# Patient Record
Sex: Male | Born: 1955 | Hispanic: Yes | Marital: Single | Smoking: Never smoker
Health system: Southern US, Community
[De-identification: ages and names within clinical notes are randomized; demographics above are authoritative.]

## PROBLEM LIST (undated history)

## (undated) DIAGNOSIS — I10 Essential (primary) hypertension: Secondary | ICD-10-CM

## (undated) DIAGNOSIS — E119 Type 2 diabetes mellitus without complications: Secondary | ICD-10-CM

## (undated) HISTORY — PX: OTHER SURGICAL HISTORY: SHX169

## (undated) SURGICAL SUPPLY — 72 items
APPLIER CLIP 5 13 M/L LIGAMAX5 (MISCELLANEOUS)
APPLIER CLIP ROT 10 11.4 M/L (STAPLE)
BLADE EXTENDED COATED 6.5IN (ELECTRODE)
BLADE HEX COATED 2.75 (ELECTRODE) ×1
BLADE SURG SZ10 CARB STEEL (BLADE) ×1
CABLE HIGH FREQUENCY MONO STRZ (ELECTRODE) ×1
CHLORAPREP W/TINT 26 (MISCELLANEOUS) ×1
COUNTER NEEDLE 20 DBL MAG RED (NEEDLE) ×1
COVER MAYO STAND STRL (DRAPES) ×1
COVER SURGICAL LIGHT HANDLE (MISCELLANEOUS) ×1
COVER WAND RF STERILE (DRAPES)
DECANTER SPIKE VIAL GLASS SM (MISCELLANEOUS) ×1
DRAIN CHANNEL 19F RND (DRAIN) ×1
DRAPE LAPAROSCOPIC ABDOMINAL (DRAPES) ×1
DRAPE SHEET LG 3/4 BI-LAMINATE (DRAPES) ×1
DRAPE UTILITY XL STRL (DRAPES) ×2
DRAPE WARM FLUID 44X44 (DRAPES) ×1
DRSG OPSITE POSTOP 4X10 (GAUZE/BANDAGES/DRESSINGS)
DRSG OPSITE POSTOP 4X6 (GAUZE/BANDAGES/DRESSINGS) ×1
DRSG OPSITE POSTOP 4X8 (GAUZE/BANDAGES/DRESSINGS)
DRSG TEGADERM 2-3/8X2-3/4 SM (GAUZE/BANDAGES/DRESSINGS) ×2
DRSG TEGADERM 4X4.75 (GAUZE/BANDAGES/DRESSINGS) ×1
ELECT REM PT RETURN 15FT ADLT (MISCELLANEOUS) ×1
ENDOLOOP SUT PDS II  0 18 (SUTURE)
GAUZE SPONGE 4X4 12PLY STRL (GAUZE/BANDAGES/DRESSINGS) ×1
GLOVE ECLIPSE 8.0 STRL XLNG CF (GLOVE) ×2
GLOVE INDICATOR 8.0 STRL GRN (GLOVE) ×2
GOWN STRL REUS W/TWL XL LVL3 (GOWN DISPOSABLE) ×4
IRRIG SUCT STRYKERFLOW 2 WTIP (MISCELLANEOUS) ×1
KIT BASIN OR (CUSTOM PROCEDURE TRAY) ×1
KIT TURNOVER KIT A (KITS)
LEGGING LITHOTOMY PAIR STRL (DRAPES) ×1
PACK GENERAL/GYN (CUSTOM PROCEDURE TRAY) ×1
PAD POSITIONING PINK XL (MISCELLANEOUS) ×1
PENCIL SMOKE EVACUATOR (MISCELLANEOUS)
PROTECTOR NERVE ULNAR (MISCELLANEOUS) ×1
RTRCTR WOUND ALEXIS 18CM MED (MISCELLANEOUS)
SCISSORS LAP 5X35 DISP (ENDOMECHANICALS) ×1
SEALER TISSUE G2 STRG ARTC 35C (ENDOMECHANICALS) ×1
SET TUBE SMOKE EVAC HIGH FLOW (TUBING) ×1
SLEEVE XCEL OPT CAN 5 100 (ENDOMECHANICALS) ×3
SPONGE GAUZE 2X2 STER 10/PKG (GAUZE/BANDAGES/DRESSINGS) ×1
SPONGE LAP 18X18 RF (DISPOSABLE) ×1
STAPLER CUT CVD 40MM BLUE (STAPLE) ×1
STAPLER ECHELON LONG 60 440 (INSTRUMENTS) ×1
STAPLER RELOAD GREEN 60MM (STAPLE) ×2
STAPLER VISISTAT 35W (STAPLE) ×1
SUCTION POOLE HANDLE (INSTRUMENTS) ×1
SURGILUBE 2OZ TUBE FLIPTOP (MISCELLANEOUS)
SUT MNCRL AB 4-0 PS2 18 (SUTURE) ×1
SUT PDS AB 1 CTX 36 (SUTURE) ×2
SUT PDS AB 1 TP1 96 (SUTURE)
SUT PROLENE 0 CT 2 (SUTURE)
SUT PROLENE 2 0 KS (SUTURE) ×1
SUT PROLENE 2 0 SH DA (SUTURE) ×2
SUT SILK 2 0 (SUTURE) ×1
SUT SILK 2 0 SH CR/8 (SUTURE) ×1
SUT SILK 3 0 (SUTURE) ×1
SUT SILK 3 0 SH CR/8 (SUTURE) ×1
SUT VICRYL 0 UR6 27IN ABS (SUTURE)
SUT VLOC 180 0 9IN  GS21 (SUTURE) ×1
SUT VLOC 180 2-0 9IN GS21 (SUTURE) ×3
SYR BULB IRRIGATION 50ML (SYRINGE) ×1
SYS LAPSCP GELPORT 120MM (MISCELLANEOUS)
TAPE UMBILICAL COTTON 1/8X30 (MISCELLANEOUS) ×1
TOWEL OR 17X26 10 PK STRL BLUE (TOWEL DISPOSABLE) ×2
TOWEL OR NON WOVEN STRL DISP B (DISPOSABLE) ×2
TRAY FOLEY MTR SLVR 16FR STAT (SET/KITS/TRAYS/PACK) ×1
TRAY LAPAROSCOPIC (CUSTOM PROCEDURE TRAY) ×1
TROCAR BLADELESS OPT 5 100 (ENDOMECHANICALS) ×1
TROCAR XCEL NON-BLD 11X100MML (ENDOMECHANICALS)
YANKAUER SUCT BULB TIP 10FT TU (MISCELLANEOUS) ×1

---

## 2018-04-17 DIAGNOSIS — E119 Type 2 diabetes mellitus without complications: Secondary | ICD-10-CM

## 2018-04-17 DIAGNOSIS — J0100 Acute maxillary sinusitis, unspecified: Secondary | ICD-10-CM

## 2018-04-17 DIAGNOSIS — R200 Anesthesia of skin: Secondary | ICD-10-CM

## 2018-04-17 HISTORY — DX: Type 2 diabetes mellitus without complications: E11.9

## 2018-04-17 HISTORY — DX: Essential (primary) hypertension: I10
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
## 2018-04-17 NOTE — Discharge Instructions (Addendum)
Radiografa de trax negativa para neumona. Augmentin prescrito Flonase prescrito Tome OTC ibuprofeno / tylenol segn sea necesario para el alivio sintomtico Se colocan muequeras, se usan segn sea necesario para el alivio sintomtico. Se inici la asistencia del PCP para una evaluacin y [place] adicionales de las enfermedades crnicas y si los sntomas persisten Regrese o vaya a la sala de emergencias si tiene sntomas nuevos o que [place].  Metformina rellenada.

## 2018-04-17 NOTE — ED Triage Notes (Signed)
Pt here for left sided facial pain, ear pain x 2 weeks. sts also body aches. sts that he has been coughing.
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
## 2019-12-29 DIAGNOSIS — K56609 Unspecified intestinal obstruction, unspecified as to partial versus complete obstruction: Secondary | ICD-10-CM

## 2019-12-29 DIAGNOSIS — D630 Anemia in neoplastic disease: Secondary | ICD-10-CM | POA: Diagnosis present

## 2019-12-29 DIAGNOSIS — E440 Moderate protein-calorie malnutrition: Secondary | ICD-10-CM | POA: Diagnosis present

## 2019-12-29 DIAGNOSIS — K567 Ileus, unspecified: Secondary | ICD-10-CM | POA: Diagnosis not present

## 2019-12-29 DIAGNOSIS — Z20822 Contact with and (suspected) exposure to covid-19: Secondary | ICD-10-CM | POA: Diagnosis present

## 2019-12-29 DIAGNOSIS — I10 Essential (primary) hypertension: Secondary | ICD-10-CM | POA: Diagnosis present

## 2019-12-29 DIAGNOSIS — Z6829 Body mass index (BMI) 29.0-29.9, adult: Secondary | ICD-10-CM

## 2019-12-29 DIAGNOSIS — R1084 Generalized abdominal pain: Secondary | ICD-10-CM

## 2019-12-29 DIAGNOSIS — E1165 Type 2 diabetes mellitus with hyperglycemia: Secondary | ICD-10-CM | POA: Insufficient documentation

## 2019-12-29 DIAGNOSIS — Z789 Other specified health status: Secondary | ICD-10-CM

## 2019-12-29 DIAGNOSIS — C187 Malignant neoplasm of sigmoid colon: Secondary | ICD-10-CM

## 2019-12-29 DIAGNOSIS — IMO0002 Reserved for concepts with insufficient information to code with codable children: Secondary | ICD-10-CM | POA: Insufficient documentation

## 2019-12-29 NOTE — ED Provider Notes (Signed)
[place] EMERGENCY DEPARTMENT Provider Note   CSN: [id number] Arrival date & time: 12/29/19  2201     History Chief Complaint  Patient presents with  . Abdominal Pain    Dakota Washington is a 64 y.o. male.  The history is provided by the patient. A language interpreter was used.  Abdominal Pain Pain location:  Generalized Pain quality: aching and bloating   Pain radiates to:  Does not radiate Pain severity:  Mild Onset quality:  Gradual Duration:  5 days Timing:  Constant Progression:  Unchanged Chronicity:  New Context: not eating, not previous surgeries and not sick contacts   Context comment:  Patient states hx of DM, HTN but not taking medications who presents to ED with abdominal. No bowel movement for 4 days. Feels constipated and with gas pains. No passing gas, but not vomiting. No hx of abdominal surgeries.  Relieved by:  Nothing Worsened by:  Nothing Associated symptoms: constipation   Associated symptoms: no chest pain, no chills, no cough, no diarrhea, no dysuria, no fever, no flatus, no hematuria, no nausea, no shortness of breath, no sore throat and no vomiting   Risk factors: no alcohol abuse, has not had multiple surgeries and no NSAID use        No past medical history on file.  There are no problems to display for this patient.   PMH: Diabetes, HTN    No family history on file.  Social History   Tobacco Use  . Smoking status: Not on file  Substance Use Topics  . Alcohol use: Not on file  . Drug use: Not on file    Home Medications Prior to Admission medications   Not on File    Allergies    Patient has no known allergies.  Review of Systems   Review of Systems  Constitutional: Negative for chills and fever.  HENT: Negative for ear pain and sore throat.   Eyes: Negative for pain and visual disturbance.  Respiratory: Negative for cough and shortness of breath.   Cardiovascular: Negative for chest pain and  palpitations.  Gastrointestinal: Positive for abdominal pain and constipation. Negative for diarrhea, flatus, nausea and vomiting.  Genitourinary: Negative for dysuria and hematuria.  Musculoskeletal: Negative for arthralgias and back pain.  Skin: Negative for color change and rash.  Neurological: Negative for seizures and syncope.  All other systems reviewed and are negative.   Physical Exam Updated Vital Signs  ED Triage Vitals  Enc Vitals Group     BP 12/29/19 2214 (!) 176/93     Pulse Rate 12/29/19 2214 80     Resp 12/29/19 2214 20     Temp 12/29/19 2214 98.7 F (37.1 C)     Temp Source 12/29/19 2214 Oral     SpO2 12/29/19 2214 98 %     Weight 12/29/19 2214 165 lb (74.8 kg)     Height 12/29/19 2214 5\' 3"  (1.6 m)     Head Circumference --      Peak Flow --      Pain Score 12/29/19 2208 9     Pain Loc --      Pain Edu? --      Excl. in [place]? --     Physical Exam Vitals and nursing note reviewed.  Constitutional:      General: He is not in acute distress.    Appearance: He is well-developed. He is not ill-appearing.  HENT:     Head: Normocephalic and atraumatic.  Eyes:     Extraocular Movements: Extraocular movements intact.     Conjunctiva/sclera: Conjunctivae normal.     Pupils: Pupils are equal, round, and reactive to light.  Cardiovascular:     Rate and Rhythm: Normal rate and regular rhythm.     Heart sounds: Normal heart sounds. No murmur.  Pulmonary:     Effort: Pulmonary effort is normal. No respiratory distress.     Breath sounds: Normal breath sounds.  Abdominal:     General: There is no distension.     Palpations: Abdomen is soft.     Tenderness: There is generalized abdominal tenderness. There is no right CVA tenderness, left CVA tenderness, guarding or rebound. Negative signs include Murphy's sign.  Musculoskeletal:     Cervical back: Neck supple.  Skin:    General: Skin is warm and dry.     Capillary Refill: Capillary refill takes less than 2  seconds.  Neurological:     General: No focal deficit present.     Mental Status: He is alert.     ED Results / Procedures / Treatments   Labs (all labs ordered are listed, but only abnormal results are displayed) Labs Reviewed  CBG MONITORING, ED - Abnormal; Notable for the following components:      Result Value   Glucose-Capillary 237 (*)    All other components within normal limits  CBC WITH DIFFERENTIAL/PLATELET  COMPREHENSIVE METABOLIC PANEL  LIPASE, BLOOD  URINALYSIS, ROUTINE W REFLEX MICROSCOPIC    EKG None  Radiology No results found.  Procedures Procedures (including critical care time)  Medications Ordered in ED Medications  sodium chloride 0.9 % bolus 1,000 mL (1,000 mLs Intravenous New Bag/Given 12/29/19 2228)  morphine 4 MG/ML injection 4 mg (4 mg Intravenous Given 12/29/19 2225)    ED Course  I have reviewed the triage vital signs and the nursing notes.  Pertinent labs & imaging results that were available during my care of the patient were reviewed by me and considered in my medical decision making (see chart for details).    MDM Rules/Calculators/A&P  Dakota Washington is a 64 year old male with history of hypertension, diabetes who presents to the ED with abdominal pain.  Patient with high blood pressure but otherwise normal vitals.  Patient with abdominal pain for the last for 5 days.  Has been constipated.  But denies any nausea or vomiting.  Has not passed gas.  No history of abdominal surgery.  Socially drinks alcohol.  Has diffuse abdominal tenderness on exam.  Overall is well-appearing.  Will evaluate for any hepatobiliary process, pancreatitis with lab work.  We will get a CT scan to evaluate for bowel obstruction versus other intra-abdominal process.  Takes no medication any longer for his high blood pressure or diabetes.  Could be DKA or gastroparesis.  Will give IV fluids, IV morphine and reevaluate.  Patient has not taken any stool softener.  Pt  signed out to oncoming ED staff with patient pending lab work and imaging. Please see there not for further results, eval, dispo.   This chart was dictated using voice recognition software.  Despite best efforts to proofread,  errors can occur which can change the documentation meaning.    Final Clinical Impression(s) / ED Diagnoses Final diagnoses:  Generalized abdominal pain    Rx / DC Orders ED Discharge Orders    None       Lennice Sites, DO 12/29/19 2246

## 2019-12-29 NOTE — ED Triage Notes (Signed)
Pt c/o abdominal pain x 4 days with constipation. No english, his son is here with him and he speaks [place]

## 2019-12-30 DIAGNOSIS — K56609 Unspecified intestinal obstruction, unspecified as to partial versus complete obstruction: Secondary | ICD-10-CM | POA: Diagnosis present

## 2019-12-30 NOTE — ED Notes (Signed)
Report given to [facility], RN

## 2019-12-30 NOTE — ED Notes (Signed)
Report Received. Pt currently in x-ray

## 2019-12-30 NOTE — ED Notes (Addendum)
Spoke with pt via Opt[facility]. Updated regarding admission to [facility] hospital and waiting for transport. Pt called son to take his valuables (wallet and clothing) home. Pt also aware that his blood sugar will be checked frequently during his hospitalization and we are using a sliding scale when readings are high. Voiced understanding. States he has not been taking his blood sugar medications for months.

## 2019-12-30 NOTE — ED Notes (Signed)
[facility] notified (Tammy) - patient ready for transport 

## 2019-12-30 NOTE — ED Provider Notes (Signed)
Care assumed from Dr. Ronnald Nian at shift change.  Patient awaiting results of a CT scan.  Patient has been experiencing abdominal distention, cramping, and pain for the past 5 days.  He has not had a bowel movement in the past 4 or 5 days.  CT scan has returned and shows what appears to be a large bowel obstruction, the etiology of which is concerning for a possible apple core lesion in the distal colon.  These findings were discussed with Dr. Kae Heller from general surgery.  She agrees to accept the patient in transfer.  He will go to [facility] for further care.  Zosyn given intravenously.   Veryl Speak, MD 12/30/19 Dakota Washington

## 2019-12-30 NOTE — ED Notes (Signed)
Pt's son Ulice Dash took patients wallet, cash money, yellow color ring, shirt, pants, boots home with him and a brown jacket, underwear, and phone charger is left with patient.

## 2019-12-30 NOTE — Progress Notes (Addendum)
All admission questions completed using [employer] [phone] [place]. Pt alert & oriented, speaks spanish.

## 2019-12-30 NOTE — ED Notes (Signed)
[facility] here to transport pt to [facility]. Interpreter called and transfer process explained to patient.

## 2019-12-31 DIAGNOSIS — Z789 Other specified health status: Secondary | ICD-10-CM

## 2019-12-31 DIAGNOSIS — E1165 Type 2 diabetes mellitus with hyperglycemia: Secondary | ICD-10-CM | POA: Insufficient documentation

## 2019-12-31 DIAGNOSIS — IMO0002 Reserved for concepts with insufficient information to code with codable children: Secondary | ICD-10-CM | POA: Insufficient documentation

## 2019-12-31 DIAGNOSIS — K56609 Unspecified intestinal obstruction, unspecified as to partial versus complete obstruction: Secondary | ICD-10-CM

## 2019-12-31 HISTORY — PX: LAPAROSCOPIC SMALL BOWEL RESECTION: SHX5929
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
## 2019-12-31 NOTE — Consult Note (Signed)
[facility] Nurse ostomy consult note  [place] Nurse requested for preoperative stoma site marking by Dr. Johney Maine.  Discussed surgical procedure and stoma creation with patient.  Explained role of the [place] nurse team.  Answered patient questions.   Examined patient lying, sitting, and standing in order to place the marking in the patient's visual field, away from any creases or abdominal contour issues and within the rectus muscle.    Marked for colostomy in the LUQ, 7.5cm to the left of the umbilicus and [id number] above the umbilicus.  Patient's abdomen cleansed with CHG wipes at site markings, allowed to air dry prior to marking.Covered mark with thin film transparent dressing.   [facility] Nurse team will follow up with patient after surgery for continue ostomy care and teaching if a stoma is created intraoperatively.  Thanks, Maudie Flakes, MSN, RN, [place], Arther Abbott  Pager# [phone]

## 2019-12-31 NOTE — Anesthesia Preprocedure Evaluation (Signed)
Anesthesia Evaluation  Patient identified by MRN, date of birth, ID band Patient awake    Reviewed: Allergy & Precautions, NPO status , Patient's Chart, lab work & pertinent test results  History of Anesthesia Complications (+) PONV and history of anesthetic complications  Airway Mallampati: II  TM Distance: >3 FB Neck ROM: Full    Dental  (+) Dental Advisory Given   Pulmonary neg pulmonary ROS,    breath sounds clear to auscultation       Cardiovascular negative cardio ROS   Rhythm:Regular     Neuro/Psych negative neurological ROS  negative psych ROS   GI/Hepatic Neg liver ROS, colonic obstruction   Endo/Other  diabetes  Renal/GU negative Renal ROS     Musculoskeletal negative musculoskeletal ROS (+)   Abdominal   Peds  Hematology negative hematology ROS (+)   Anesthesia Other Findings   Reproductive/Obstetrics                            Anesthesia Physical Anesthesia Plan  ASA: II  Anesthesia Plan: General   Post-op Pain Management:    Induction: Intravenous  PONV Risk Score and Plan: 3 and Ondansetron, Dexamethasone, Propofol infusion and Midazolam  Airway Management Planned: Oral ETT  Additional Equipment: None  Intra-op Plan:   Post-operative Plan: Extubation in OR  Informed Consent: I have reviewed the patients History and Physical, chart, labs and discussed the procedure including the risks, benefits and alternatives for the proposed anesthesia with the patient or authorized representative who has indicated his/her understanding and acceptance.     Dental advisory given  Plan Discussed with: Surgeon and CRNA  Anesthesia Plan Comments:         Anesthesia Quick Evaluation

## 2019-12-31 NOTE — Progress Notes (Signed)
Two attempts to call and speak to Ulice Dash, pts son; no answer and no option for voicemail. Donne Hazel, RN
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
## 2019-12-31 NOTE — Transfer of Care (Signed)
Immediate Anesthesia Transfer of Care Note  Patient: Bert Herrera  Procedure(s) Performed: LAPAROSCOPIC sigmoid colectomy; hand sewn anastomosis, tap block rigid proctoscopy (N/A )  Patient Location: PACU  Anesthesia Type:General  Level of Consciousness: awake  Airway & Oxygen Therapy: Patient Spontanous Breathing and Patient connected to face mask oxygen  Post-op Assessment: Report given to RN, Post -op Vital signs reviewed and stable and Patient moving all extremities X 4  Post vital signs: Reviewed and stable  Last Vitals:  Vitals Value Taken Time  BP    Temp    Pulse 68 12/31/19 1516  Resp 21 12/31/19 1516  SpO2 100 % 12/31/19 1516  Vitals shown include unvalidated device data.  Last Pain:  Vitals:   12/31/19 0946  TempSrc:   PainSc: 0-No pain         Complications: No apparent anesthesia complications

## 2019-12-31 NOTE — Anesthesia Procedure Notes (Signed)
Procedure Name: Intubation Date/Time: 12/31/2019 11:34 AM Performed by: Niel Hummer, CRNA Pre-anesthesia Checklist: Patient identified, Emergency Drugs available, Suction available and Patient being monitored Patient Re-evaluated:Patient Re-evaluated prior to induction Oxygen Delivery Method: Circle system utilized Preoxygenation: Pre-oxygenation with 100% oxygen Induction Type: IV induction Ventilation: Mask ventilation without difficulty Laryngoscope Size: Mac and 4 Grade View: Grade I Tube type: Oral Tube size: 7.5 mm Number of attempts: 1 Airway Equipment and Method: Stylet Placement Confirmation: ETT inserted through vocal cords under direct vision,  positive ETCO2 and breath sounds checked- equal and bilateral Secured at: 23 cm Tube secured with: Tape Dental Injury: Teeth and Oropharynx as per pre-operative assessment

## 2019-12-31 NOTE — Progress Notes (Signed)
Short Stay staff called to inform me that surgery has been moved up to 1015. Attempted to call son twice but no answer and no voicemail option. Donne Hazel, RN

## 2020-01-01 NOTE — TOC Initial Note (Addendum)
Transition of Care [facility]) - Initial/Assessment Note    Patient Details  Name: Dakota Washington MRN: [id number] Date of Birth: 01/21/1956  Transition of Care [facility]) CM/SW Contact:    Lia Hopping, [place] Phone Number: 01/01/2020, 11:22 AM  Clinical Narrative:                 CSW met with the patient at bedside to discuss PCP and medication assistance. Patient called his son for support. Patient son express concerns about the patient not having insurance. CSW explain [employer] Counselor has completed a Insurance underwriter on patient behalf, it's unknown if the patient will qualify at this point.  CSW explain need for a PCP. CSW arranged [place] appointment for January 23, 2020 @ 9:20AM. Information written on patient AVS.  Patient will need assistance with medications at discharge. [place] letter will be provided.     Expected Discharge Plan: Home/Self Care Barriers to Discharge: Inadequate or no insurance   Patient Goals and CMS Choice     Choice offered to / list presented to : NA  Expected Discharge Plan and Services Expected Discharge Plan: Home/Self Care In-house Referral: Clinical Social Work Discharge Planning Services: NA Post Acute Care Choice: NA                                        Prior Living Arrangements/Services   Lives with:: Self Patient language and need for interpreter reviewed:: No Do you feel safe going back to the place where you live?: Yes      Need for Family Participation in Patient Care: Yes (Comment) Care giver support system in place?: No (comment)   Criminal Activity/Legal Involvement Pertinent to Current Situation/Hospitalization: No - Comment as needed  Activities of Daily Living Home Assistive Devices/Equipment: Dentures (specify type), CBG Meter ADL Screening (condition at time of admission) Patient's cognitive ability adequate to safely complete daily activities?: Yes Is the patient deaf or have  difficulty hearing?: No Does the patient have difficulty seeing, even when wearing glasses/contacts?: No Does the patient have difficulty concentrating, remembering, or making decisions?: No Patient able to express need for assistance with ADLs?: Yes Does the patient have difficulty dressing or bathing?: No Independently performs ADLs?: Yes (appropriate for developmental age) Does the patient have difficulty walking or climbing stairs?: No Weakness of Legs: None Weakness of Arms/Hands: None  Permission Sought/Granted Permission sought to share information with : Family Supports Permission granted to share information with : Yes, Verbal Permission Granted        Permission granted to share info w Relationship: Son "Dakota Washington"     Emotional Assessment Appearance:: Appears stated age Attitude/Demeanor/Rapport: Engaged Affect (typically observed): Accepting Orientation: : Oriented to Self, Oriented to Place, Oriented to  Time, Oriented to Situation Alcohol / Substance Use: Not Applicable Psych Involvement: No (comment)  Admission diagnosis:  Colonic obstruction ([place]) [K56.609] Generalized abdominal pain [R10.84] Large bowel obstruction ([place]) [K56.609] Patient Active Problem List   Diagnosis Date Noted  . Uses Spanish as primary spoken language 12/31/2019  . DM (diabetes mellitus), type 2, uncontrolled ([place])   . Large bowel obstruction ([place]) 12/30/2019  . Colonic obstruction ([place]) 12/30/2019   PCP:  Patient, No Pcp Per Pharmacy:  No Pharmacies Listed    Social Determinants of Health (SDOH) Interventions    Readmission Risk Interventions No flowsheet data found.

## 2020-01-01 NOTE — Progress Notes (Addendum)
Inpatient Diabetes Program Recommendations  AACE/ADA: New Consensus Statement on Inpatient Glycemic Control (2015)  Target Ranges:  Prepandial:   less than 140 mg/dL      Peak postprandial:   less than 180 mg/dL (1-2 hours)      Critically ill patients:  140 - 180 mg/dL   Lab Results  Component Value Date   GLUCAP 222 (H) 01/01/2020   HGBA1C 12.4 (H) 12/30/2019    [employer] interpreter ipad not available on floor for DM education.  Tried calling son to set up a time tomorrow 1/27 to speak with him and patient together regarding DM and insulin. No answer and voicemail box not set up for me to leave message will try again later.  Addendum 2:27 pm got in touch with Ulice Dash, pts son will meet 10 am 1/27 for insulin and DM teaching.  Thanks, Tama Headings RN, MSN, BC-ADM Inpatient Diabetes Coordinator Team Pager [phone] (8a-5p)

## 2020-01-01 NOTE — Progress Notes (Signed)
PROGRESS NOTE  Dakota Washington  DOB: 09/13/1956  PCP: Patient, No Pcp Per [id number]  DOA: 12/29/2019 Admitted From: Home  LOS: 2 days   Chief Complaint  Patient presents with   Abdominal Pain   Brief narrative: Patient is a 64 y.o. hispanic male with PMHx remarkable for diabetes mellitus and hypertension not on any meds at home. Patient presented to the ED on 1/23 with complaint of nausea/vomiting, generalized abdominal pain with constipation over the previous 5 days. He is found to have a large bowel obstruction with focus in the distal sigmoid colon.   Patient was admitted under general surgery service and started on conservative management 1/25, patient underwent laparoscopic sigmoid colectomy with anastomosis. Hospitalist medicine service was consulted for perioperative medical management.  Subjective: Patient was seen and examined this morning.  Pleasant middle-aged Hispanic male.  Sitting up in chair.  Not in distress.  Pain controlled. Patient's son was on the phone to help with interpretation.  Assessment/Plan: Large bowel obstruction -Patient presenting with 5-day history of nausea/vomiting and abdominal pain.   -Found to have large bowel obstruction with a focus at distal sigmoid colon.   -1/25, patient underwent laparoscopic sigmoid colectomy with anastomosis. -Currently improving postoperative ileus.  On dysphagia 1 diet.   Type 2 diabetes mellitus -uncontrolled, A1c 12.4 -He has been started on Lantus 12 units nightly from last night. -We will continue the same.  Once oral intake is allowed, will also start him on oral agents. -Continue sliding scale insulin with Accu-Cheks. -Diabetes coordinator consult consult.  Essential hypertension -Blood pressure fluctuating, mostly in normal range.  Continue to monitor.   -Likely to benefit from low-dose ACE inhibitor on discharge for renal protection  Mobility: Encourage ambulation Diet: Dysphagia 1 diet, to  advance per surgery Fluid:  DVT prophylaxis:  Lovenox subcu Code Status:  Full code Family Communication:  None Expected Discharge:  Continue inpatient management.  General surgery is the primary team.  Antimicrobials: Anti-infectives (From admission, onward)   Start     Dose/Rate Route Frequency Ordered Stop   12/31/19 2200  cefoTEtan (CEFOTAN) 2 g in sodium chloride 0.9 % 100 mL IVPB     2 g 200 mL/hr over 30 Minutes Intravenous Every 12 hours 12/31/19 1624 01/01/20 0553   12/31/19 1000  cefoTEtan (CEFOTAN) 2 g in sodium chloride 0.9 % 100 mL IVPB     2 g 200 mL/hr over 30 Minutes Intravenous On call to O.R. 12/31/19 0744 01/01/20 0501   12/30/19 0600  piperacillin-tazobactam (ZOSYN) IVPB 3.375 g  Status:  Discontinued     3.375 g 12.5 mL/hr over 240 Minutes Intravenous Every 8 hours 12/30/19 0256 12/31/19 0753   12/30/19 0015  piperacillin-tazobactam (ZOSYN) IVPB 3.375 g     3.375 g 12.5 mL/hr over 240 Minutes Intravenous  Once 12/30/19 0003 12/30/19 0146        Code Status: Full Code   Diet Order            DIET - DYS 1 Room service appropriate? Yes; Fluid consistency: Thin  Diet effective now              Infusions:   sodium chloride 250 mL (01/01/20 0554)   lactated ringers     methocarbamol (ROBAXIN) IV      Scheduled Meds:  acetaminophen  1,000 mg Oral Q6H   alvimopan  12 mg Oral BID   Chlorhexidine Gluconate Cloth  6 each Topical Daily   enoxaparin (LOVENOX) injection  40 mg  Subcutaneous Q24H   feeding supplement  237 mL Oral BID BM   feeding supplement (GLUCERNA SHAKE)  237 mL Oral BID BM   gabapentin  300 mg Oral BID   insulin aspart  0-15 Units Subcutaneous Q4H   insulin glargine  12 Units Subcutaneous QHS   lip balm  1 application Topical BID   multivitamin  15 mL Oral Daily   pantoprazole  40 mg Oral Daily   potassium chloride  30 mEq Oral BID   psyllium  1 packet Oral Daily   sodium chloride flush  3 mL Intravenous Q12H     PRN meds: sodium chloride, alum & mag hydroxide-simeth, diphenhydrAMINE **OR** diphenhydrAMINE, guaiFENesin-dextromethorphan, hydrALAZINE, hydrocortisone, hydrocortisone cream, HYDROmorphone (DILAUDID) injection, lactated ringers, magic mouthwash, menthol-cetylpyridinium, methocarbamol (ROBAXIN) IV, metoprolol tartrate, ondansetron **OR** ondansetron (ZOFRAN) IV, oxyCODONE, phenol, polyvinyl alcohol, prochlorperazine, sodium chloride flush   Objective: Vitals:   01/01/20 0754 01/01/20 1335  BP: 106/63 125/63  Pulse: 65 73  Resp:  16  Temp:  99.6 F (37.6 C)  SpO2: 100% 100%    Intake/Output Summary (Last 24 hours) at 01/01/2020 1353 Last data filed at 01/01/2020 0900 Gross per 24 hour  Intake 2671.5 ml  Output 2266 ml  Net 405.5 ml   Filed Weights   12/29/19 2214 12/31/19 0948  Weight: 74.8 kg 74.8 kg   Weight change:  Body mass index is 29.21 kg/m.   Physical Exam: General exam: Appears calm and comfortable.  Skin: No rashes, lesions or ulcers. HEENT: Atraumatic, normocephalic, supple neck, no obvious bleeding Lungs: Clear to auscultation bilaterally CVS: Regular rate rhythm good GI/Abd soft, mild appropriate tenderness postsurgical, bowel sounds sluggish CNS: Alert, awake, oriented x3 Psychiatry: Mood appropriate Extremities: No pedal edema, no calf tenderness  Data Review: I have personally reviewed the laboratory data and studies available.  Recent Labs  Lab 12/29/19 2229 12/30/19 0443 01/01/20 0315  WBC 10.8* 9.0 9.3  NEUTROABS 7.1  --   --   HGB 11.9* 11.4* 9.9*  HCT 38.2* 36.2* 32.2*  MCV 73.3* 74.0* 74.4*  PLT 386 344 329   Recent Labs  Lab 12/29/19 2229 12/30/19 0443 01/01/20 0315  NA 132* 134* 132*  K 3.4* 3.8 3.4*  CL 102 105 102  CO2 22 22 22   GLUCOSE 276* 292* 182*  BUN 14 12 11   CREATININE 0.61 0.54* 0.71  CALCIUM 8.5* 8.0* 7.7*  MG  --  2.0  --     Terrilee Croak, MD  [facility] 01/01/2020

## 2020-01-01 NOTE — Progress Notes (Signed)
Patient ID: Dakota Washington, male   DOB: 1956-09-28, 64 y.o.   MRN: [id number]    1 Day Post-Op  Subjective: Patient feels great today.  Denies any pain.  He is passing flatus.  Tolerated his D1 diet well this morning.  Has already walked.  ROS: See above, otherwise other systems negative  Objective: Vital signs in last 24 hours: Temp:  [97.9 F (36.6 C)-98.3 F (36.8 C)] 98.3 F (36.8 C) (01/26 0436) Pulse Rate:  [65-77] 65 (01/26 0754) Resp:  [17-29] 20 (01/26 0436) BP: (99-150)/(52-75) 106/63 (01/26 0754) SpO2:  [98 %-100 %] 100 % (01/26 0754) Last BM Date: 12/31/19  Intake/Output from previous day: 01/25 0701 - 01/26 0700 In: 2952.8 [P.O.:120; I.V.:2732.8; IV Piggyback:100] Out: 1841 [Urine:1450; Drains:365; Stool:1; Blood:25] Intake/Output this shift: Total I/O In: -  Out: 24 [Urine:425]  PE: Abd: soft, minimally tender, +BS, all incisions are intact, pfannenstiel incision with some old bloody drainage, but intact.  JP with serosang output  Lab Results:  Recent Labs    12/30/19 0443 01/01/20 0315  WBC 9.0 9.3  HGB 11.4* 9.9*  HCT 36.2* 32.2*  PLT 344 329   BMET Recent Labs    12/30/19 0443 01/01/20 0315  NA 134* 132*  K 3.8 3.4*  CL 105 102  CO2 22 22  GLUCOSE 292* 182*  BUN 12 11  CREATININE 0.54* 0.71  CALCIUM 8.0* 7.7*   PT/INR Recent Labs    12/30/19 0443  LABPROT 13.9  INR 1.1   CMP     Component Value Date/Time   NA 132 (L) 01/01/2020 0315   K 3.4 (L) 01/01/2020 0315   CL 102 01/01/2020 0315   CO2 22 01/01/2020 0315   GLUCOSE 182 (H) 01/01/2020 0315   BUN 11 01/01/2020 0315   CREATININE 0.71 01/01/2020 0315   CALCIUM 7.7 (L) 01/01/2020 0315   PROT 8.1 12/29/2019 2229   ALBUMIN 3.8 12/29/2019 2229   AST 31 12/29/2019 2229   ALT 34 12/29/2019 2229   ALKPHOS 98 12/29/2019 2229   BILITOT 0.7 12/29/2019 2229   GFRNONAA >60 01/01/2020 0315   GFRAA >60 01/01/2020 0315   Lipase     Component Value Date/Time   LIPASE 29  12/29/2019 2229       Studies/Results: No results found.  Anti-infectives: Anti-infectives (From admission, onward)   Start     Dose/Rate Route Frequency Ordered Stop   12/31/19 2200  cefoTEtan (CEFOTAN) 2 g in sodium chloride 0.9 % 100 mL IVPB     2 g 200 mL/hr over 30 Minutes Intravenous Every 12 hours 12/31/19 1624 01/01/20 0553   12/31/19 1000  cefoTEtan (CEFOTAN) 2 g in sodium chloride 0.9 % 100 mL IVPB     2 g 200 mL/hr over 30 Minutes Intravenous On call to O.R. 12/31/19 0744 01/01/20 0501   12/30/19 0600  piperacillin-tazobactam (ZOSYN) IVPB 3.375 g  Status:  Discontinued     3.375 g 12.5 mL/hr over 240 Minutes Intravenous Every 8 hours 12/30/19 0256 12/31/19 0753   12/30/19 0015  piperacillin-tazobactam (ZOSYN) IVPB 3.375 g     3.375 g 12.5 mL/hr over 240 Minutes Intravenous  Once 12/30/19 0003 12/30/19 0146       Assessment/Plan POD 1, s/p lap assisted sigmoid colectomy for LBO -ERAS protocol in place.   -D1 diet today -mobilize -entereg -good pain control -1 dose of post op abx per protocol -cont JP Drain, but can be DC prior to discharge if he is moving his bowels  with minimal serosang output.  FEN: D1 diet, SLV VTE: Lovenox ID: 1 dose post op given    LOS: 2 days    Henreitta Cea , [facility] 01/01/2020, 10:16 AM Please see Amion for pager number during day hours 7:00am-4:30pm or 7:00am -11:30am on weekends

## 2020-01-02 DIAGNOSIS — C187 Malignant neoplasm of sigmoid colon: Secondary | ICD-10-CM

## 2020-01-02 NOTE — Anesthesia Postprocedure Evaluation (Signed)
Anesthesia Post Note  Patient: Dakota Washington  Procedure(s) Performed: LAPAROSCOPIC sigmoid colectomy; hand sewn anastomosis, tap block rigid proctoscopy (N/A )     Patient location during evaluation: PACU Anesthesia Type: General Level of consciousness: awake and alert Pain management: pain level controlled Vital Signs Assessment: post-procedure vital signs reviewed and stable Respiratory status: spontaneous breathing, nonlabored ventilation, respiratory function stable and patient connected to nasal cannula oxygen Cardiovascular status: blood pressure returned to baseline and stable Postop Assessment: no apparent nausea or vomiting Anesthetic complications: no    Last Vitals:  Vitals:   01/02/20 0414 01/02/20 0758  BP: 138/73 (!) 148/67  Pulse: 72 82  Resp: 16 16  Temp: 37.2 C 36.9 C  SpO2: 100% 100%    Last Pain:  Vitals:   01/02/20 0758  TempSrc: Oral  PainSc: 1                  Sharayah Renfrow

## 2020-01-02 NOTE — Progress Notes (Signed)
Inpatient Diabetes Program Recommendations  AACE/ADA: New Consensus Statement on Inpatient Glycemic Control (2015)  Target Ranges:  Prepandial:   less than 140 mg/dL      Peak postprandial:   less than 180 mg/dL (1-2 hours)      Critically ill patients:  140 - 180 mg/dL   Lab Results  Component Value Date   GLUCAP 141 (H) 01/02/2020   HGBA1C 12.4 (H) 12/30/2019    Review of Glycemic Control  Diabetes history: DM 2  Spoke with patient and son regarding A1c level and glucose control at home. Patient has a follow up appointment at [facility] on February 17 th. Discussed A1C results (12.4% this admission). Discussed glucose and A1C goals. Discussed importance of checking CBGs and maintaining good CBG control to prevent long-term and short-term complications. Explained how hyperglycemia leads to damage within blood vessels which lead to the common complications seen with uncontrolled diabetes. Stressed to the patient the importance of improving glycemic control to prevent further complications from uncontrolled diabetes.   Discussed hyper/hypoglycemia s/s and treatment for both.  Discussed impact of nutrition, exercise, stress, sickness, and medications on diabetes control. Discussed carbohydrates, carbohydrate goals per day and meal, along with portion sizes. Encouraged patient to check glucose at least 2 times per day (fasting and alternating second check) and to keep a log book of glucose readings and insulin taken which will need to be taken to doctor appointments. Explained how the doctor can use the log book to continue to make insulin adjustments if needed.    Educated patient and son on insulin pen use at home. Reviewed contents of insulin flexpen starter kit. Reviewed all steps if insulin pen including attachment of needle, 2-unit air shot, dialing up dose, giving injection, removing needle, disposal of sharps, storage of unused insulin, disposal of insulin etc. Patient able  to provide successful return demonstration. Also reviewed troubleshooting with insulin pen.   Also showed how to use Vial and syringe method in case it has to be used in the future.  MD to give patient Rxs for insulin pens and insulin pen needles.  Patient and son verbalized understanding of information discussed and he states that he has no further questions at this time related to diabetes.  Thanks,  Tama Headings RN, MSN, BC-ADM Inpatient Diabetes Coordinator Team Pager [phone] (8a-5p)

## 2020-01-02 NOTE — Progress Notes (Signed)
Pharmacy Brief Note - Alvimopan (Entereg)  The standing order set for alvimopan (Entereg) now includes an automatic order to discontinue the drug after the patient has had a bowel movement. The change was approved by the [place] and the Medical Executive Committee.   This patient has had bowel movements documented by nursing. Therefore, alvimopan has been discontinued. If there are questions, please contact the pharmacy at [phone].   Thank you- Dolly Rias RPh 01/02/2020, 11:49 AM

## 2020-01-02 NOTE — Progress Notes (Signed)
Patient ID: Dakota Washington, male   DOB: 01/21/1956, 64 y.o.   MRN: [id number]    2 Days Post-Op  Subjective: Patient feels well today.  Up walking around his room.  Had another BM today with no blood present.  No nausea.  DM coordinator in room now for education with the patient and son.  Had a long conversation with the son about post op expectations, path is pending, etc.  He is very Patent attorney.  ROS: See above, otherwise other systems negative  Objective: Vital signs in last 24 hours: Temp:  [98.4 F (36.9 C)-99.6 F (37.6 C)] 98.4 F (36.9 C) (01/27 0758) Pulse Rate:  [72-82] 82 (01/27 0758) Resp:  [16-17] 16 (01/27 0758) BP: (125-148)/(63-73) 148/67 (01/27 0758) SpO2:  [98 %-100 %] 100 % (01/27 0758) Last BM Date: 01/02/20  Intake/Output from previous day: 01/26 0701 - 01/27 0700 In: 873.7 [P.O.:720; I.V.:153.7] Out: 2235 [Urine:2100; Drains:135] Intake/Output this shift: Total I/O In: 480 [P.O.:480] Out: -   PE: Abd: soft, appropriately tender, lower incision is stable with wicks in place, all other lap incisions are covered with no drainage, +BS, ND  Lab Results:  Recent Labs    01/01/20 0315 01/02/20 0323  WBC 9.3 10.2  HGB 9.9* 9.9*  HCT 32.2* 31.5*  PLT 329 321   BMET Recent Labs    01/01/20 0315 01/02/20 0323  NA 132* 140  K 3.4* 3.7  CL 102 109  CO2 22 25  GLUCOSE 182* 95  BUN 11 10  CREATININE 0.71 0.69  CALCIUM 7.7* 8.3*   PT/INR No results for input(s): LABPROT, INR in the last 72 hours. CMP     Component Value Date/Time   NA 140 01/02/2020 0323   K 3.7 01/02/2020 0323   CL 109 01/02/2020 0323   CO2 25 01/02/2020 0323   GLUCOSE 95 01/02/2020 0323   BUN 10 01/02/2020 0323   CREATININE 0.69 01/02/2020 0323   CALCIUM 8.3 (L) 01/02/2020 0323   PROT 8.1 12/29/2019 2229   ALBUMIN 3.8 12/29/2019 2229   AST 31 12/29/2019 2229   ALT 34 12/29/2019 2229   ALKPHOS 98 12/29/2019 2229   BILITOT 0.7 12/29/2019 2229   GFRNONAA >60  01/02/2020 0323   GFRAA >60 01/02/2020 0323   Lipase     Component Value Date/Time   LIPASE 29 12/29/2019 2229       Studies/Results: No results found.  Anti-infectives: Anti-infectives (From admission, onward)   Start     Dose/Rate Route Frequency Ordered Stop   12/31/19 2200  cefoTEtan (CEFOTAN) 2 g in sodium chloride 0.9 % 100 mL IVPB     2 g 200 mL/hr over 30 Minutes Intravenous Every 12 hours 12/31/19 1624 01/01/20 0553   12/31/19 1000  cefoTEtan (CEFOTAN) 2 g in sodium chloride 0.9 % 100 mL IVPB     2 g 200 mL/hr over 30 Minutes Intravenous On call to O.R. 12/31/19 0744 01/01/20 0501   12/30/19 0600  piperacillin-tazobactam (ZOSYN) IVPB 3.375 g  Status:  Discontinued     3.375 g 12.5 mL/hr over 240 Minutes Intravenous Every 8 hours 12/30/19 0256 12/31/19 0753   12/30/19 0015  piperacillin-tazobactam (ZOSYN) IVPB 3.375 g     3.375 g 12.5 mL/hr over 240 Minutes Intravenous  Once 12/30/19 0003 12/30/19 0146       Assessment/Plan Uncontrolled DM - appreciate medicine assistance as well as DM coordinator  POD 2, s/p lap assisted sigmoid colectomy for LBO -ERAS protocol in place.   -  soft, carb mod diet today -mobilize -entereg -good pain control -1 dose of post op abx per protocol -cont JP Drain, but can be DC prior to discharge if he is moving his bowels with minimal serosang output. -path pending  FEN: low fiber, carb mod diet, SLIV VTE: Lovenox ID: 1 dose post op given Dispo - suspect home in am   LOS: 3 days    Henreitta Cea , [facility] 01/02/2020, 11:33 AM Please see Amion for pager number during day hours 7:00am-4:30pm or 7:00am -11:30am on weekends

## 2020-01-03 NOTE — Progress Notes (Signed)
Diabetes coordinator worked with patient and son yesterday by showing them insulin pen and how to use.   Patient does not have insurance per [employer]. If discharged on insulin, will need affordable insulin such as [employer] Relion insulin pens (70/30 insulin or NPH, Regular) Patient will need to follow up with a PCP at [place].   Will continue to monitor blood sugars while in the hospital.  Harvel Ricks RN BSN CDE Diabetes Coordinator Pager: [phone]  8am-5pm

## 2020-01-03 NOTE — Discharge Summary (Signed)
Patient ID: Dakota Washington [id number] 01-25-1956 64 y.o.  Admit date: 12/29/2019 Discharge date: 01/03/2020  Admitting Diagnosis: Large bowel obstruction DM  Discharge Diagnosis Patient Active Problem List   Diagnosis Date Noted  . Cancer of sigmoid colon pT4a, pN1c (0/19 LN) s/p lap colectomy 1/272021 01/02/2020  . Uses Spanish as primary spoken language 12/31/2019  . DM (diabetes mellitus), type 2, uncontrolled ([place])   . Large bowel obstruction ([place]) 12/30/2019  . Colonic obstruction ([place]) 12/30/2019  adenocarcinoma of colon, T4, N1   Consultants Internal medicine  Reason for Admission: 64yo spanish-speaking male with generalized abdominal pain and constipation x 5 days.  He has a history of diabetes and hypertension but not taking any medications.  Denies nausea/emesis.  Denies history of abdominal surgeries.  Prior to the onset of the symptoms he reports no issues with his bowel movements, denies any melena or hematochezia.  He has never had a colonoscopy.  Currently his pain is somewhat improved with medication.  Procedures Dr. Johney Maine, 12/31/19  LAPAROSCOPIC SIGMOID COLECTOMY with hand sewn anastomosis OMENTOPEXY TAP BLOCK - BILATERAL RIGID PROCTOSCOPY  Hospital Course:  Large bowel obstruction: The patient was admitted and taken to the OR where he underwent the above procedure.  He tolerated this procedure well and was placed on the ERAS protocol post operatively.  He was started on clear liquids right away and advanced within the first 2 days to a soft diet.  He was moving his bowels well. Pain was controlled and he was voiding as well.  He did have a JP drain placed to evaluate for post op leak.  This remained serosang with minimal output and was pulled on POD 3 prior to discharge.  His pathology returned showing T4, N1 adenocarcinoma.  This pathology was discussed with he and his son prior to discharge.  Our office is working on a referral to med onc as well as  placing him on the tumor board agenda to further discuss treatment.  DM: The patient does not have insurance and has not been able to afford his meds so he has uncontrolled DM.  The hospitalist were consulted for management and better control of this.  He was placed on lantus, metformin, and glipizide.  His sugars were much better controlled.  He and his son had a lot of education by the diabetes coordinator as well.  He will have follow up arranged at the [facility].  A Medicaid application was started for him as well during his stay.    On POD 3, the patient was surgically and medically stable for DC home.  Physical Exam: Heart: regular Lungs: CTAB Abd: soft, appropriately tender, pfannenstiel incision wicks removed and otherwise intact.  No evidence of infection or erythema.  JP is serosang and will be removed prior to discharge.  Allergies as of 01/03/2020   No Known Allergies     Medication List    TAKE these medications   acetaminophen 500 MG tablet Commonly known as: TYLENOL Take 2 tablets (1,000 mg total) by mouth every 6 (six) hours as needed.   gabapentin 300 MG capsule Commonly known as: NEURONTIN Take 1 capsule (300 mg total) by mouth 2 (two) times daily.   glipiZIDE 5 MG tablet Commonly known as: GLUCOTROL Take 0.5 tablets (2.5 mg total) by mouth daily before breakfast. Start taking on: January 04, 2020   glucose monitoring kit monitoring kit 1 each by Does not apply route daily as needed for other.  1 month Diabetic Testing Supplies for QAC-QHS accuchecks. Notes to patient: Use as prescribed   Insulin Glargine 100 UNIT/ML Solostar Pen Commonly known as: LANTUS Inject 10 Units into the skin daily at 10 pm.   metFORMIN 500 MG tablet Commonly known as: GLUCOPHAGE Take 1 tablet (500 mg total) by mouth 2 (two) times daily with a meal.   methocarbamol 500 MG tablet Commonly known as: Robaxin Take 2 tablets (1,000 mg total) by mouth every 8  (eight) hours as needed for muscle spasms.   oxyCODONE 5 MG immediate release tablet Commonly known as: Oxy IR/ROXICODONE Take 1-2 tablets (5-10 mg total) by mouth every 6 (six) hours as needed for up to 7 days for moderate pain.        Follow-up [place]. Go to.   Specialty: Internal Medicine Why: Your medical appointment is scheduled for January 23, 2020 at 9:20am. Please arrive 15 minutes early.  Su cita mdica est programada para el 42 de febrero de 2021 a las 9:20 a.m. Por favor, llegue 15 minutos antes. Contact information: [place] [id number] [place] [address] [phone]       Michael Boston, MD Follow up on 01/21/2020.   Specialty: General Surgery Why: 11:30am, arrive by 11:00am for paperwork and check in process. Bring photo ID and insurance information. Contact information: [address] [place] [address] [place] Follow up.   Why: They will call you to give you the information regarding a follow up appointment to discuss further treatment          Signed: Saverio Danker, [facility] 01/03/2020, 12:34 PM Please see Amion for pager number during day hours 7:00am-4:30pm

## 2020-01-03 NOTE — TOC Transition Note (Signed)
Transition of Care [facility]) - CM/SW Discharge Note   Patient Details  Name: [name] MRN: [id number] Date of Birth: 01/21/1956  Transition of Care [facility]) CM/SW Contact:  Lia Hopping, [place] Phone Number: 01/03/2020, 11:09 AM   Clinical Narrative:    CSW used the interpreting service [phone]. CSW explain the [facility] letter. Patient gave CSW permission to talk with this daughter Evelena Peat. He reports he plans to stay with her until he feels well enough to return home. CSW reached out to [place] and explained the [facility] letter process/narcotics not included.Patient daughter report understanding.    Final next level of care: Home/Self Care Barriers to Discharge: Barriers Resolved   Patient Goals and CMS Choice     Choice offered to / list presented to : NA  Discharge Placement                  Name of family member notified: Daughter [facility]    Discharge Plan and Services In-house Referral: Clinical Social Work Discharge Planning Services: NA Post Acute Care Choice: NA                               Social Determinants of Health (SDOH) Interventions     Readmission Risk Interventions No flowsheet data found.

## 2020-01-03 NOTE — Discharge Instructions (Signed)
A1c 12.4% goal 7% or less Carbs per meal 60-75 grams Per snack 15-30 grams  Low blood sugar less than 70 Silly sweaty shaky  quick sugar:  4 oz regular juice or soda or 4 glucose (sugar) tablets from [facility] (10ct. For $1) 15 grams of carbs wait 15 minutes then recheck glucose if still symptomatic treat again. If repeating a lot call 911  Exercise 30 minutes 5 days a week Check blood sugar 2 hours after exercise and at bedtime (avoid hypoglycemia)  Regularly check glucose first thing in the morning before you eat. Then alternate a second check (before lunch, before supper another day, before bedtime another day)   Glucose Products:  ReliOnT glucose products raise low blood sugar fast. Tablets are free of fat, caffeine, sodium and gluten. They are portable and easy to carry, making it easier for people with diabetes to BE PREPARED for lows.  Glucose Tablets Available in 6 flavors . 10 ct...................................... $1.00 . 50 ct...................................... $3.98 Glucose Shot..................................$1.48 Glucose Gel....................................$3.44  Alcohol Swabs Alcohol swabs are used to sterilize your injection site. All of our swabs are individually wrapped for maximum safety, convenience and moisture retention. ReliOnT Alcohol Swabs . 100 ct Swabs..............................$1.00 . 400 ct Swabs..............................$3.74  Lancets ReliOnT offers three lancet options conveniently designed to work with almost every lancing device. Each features a protective disk, which guarantees sterility before testing. ReliOnT Lancets . 100 ct Lancets $1.56 . 200 ct Lancets $2.64 Available in Ultra-Thin, Thin & Micro-Thin ReliOnT 2-IN-1 Lancing Device . 50 ct Lancets..................................... $3.44 Available in 30 gauge and 25 gauge ReliOnT Lancing Device....................$5.84  Blood Glucose Monitors ReliOnT offers a full  range of blood glucose testing options to provide an accurate, affordable system that meets each person's unique needs and preferences. Prime Meter....................................... $9.00 Prime Test Strips . 25 test strips.................................... $5.00 . 50 test strips.................................... $9.00 . 100 test strips.................................$17.88 Premier BLU Meter  ............  $18.98 Premier Voice Meter  .............  $14.98 Premier Test Strips . 50 test strips.................................... $9.00 . 100 test strips.................................$17.88 Premier [employer]  ............  $19.44 Kit includes: . 50 test strips . 10 lancets . Lancing device . Carry case  Ketone Test Strips . 50 test strips  ................  $6.64  Human Insulin  Novolin/ReliOnT (recombinant DNA origin) is manufactured for [employer] by [employer] Insulin* with Vial..........$24.88 Available in N, R, 70/30 Novolin/ReliOnT Insulin Pens*  .....  $42.88 Available in N, R, 70/30  Insulin Delivery ReliOnT syringes and pen needles provide precision technology, comfort and accuracy in insulin delivery at affordable prices. ReliOnT Pen Needles* . 50 ct....................................................$9.00 Available in 74m, 684m 89m73m 58m90mliOnT Insulin Syringes* . 100 ct ............ $12.58 Available in 29G, 30G & 31G (3/10cc, 1/2cc & 1cc units)   SURGERY: POST OP INSTRUCTIONS (Surgery for small bowel obstruction, colon resection, etc)   ######################################################################  EAT Gradually transition to a high fiber diet with a fiber supplement over the next few days after discharge  WALK Walk an hour a day.  Control your pain to do that.    CONTROL PAIN Control pain so that you can walk, sleep, tolerate sneezing/coughing, go up/down stairs.  HAVE A BOWEL MOVEMENT DAILY Keep your  bowels regular to avoid problems.  OK to try a laxative to override constipation.  OK to use an antidairrheal to slow down diarrhea.  Call if not better after 2 tries  CALL IF YOU HAVE PROBLEMS/CONCERNS Call if you are still struggling despite following these instructions. Call if you  have concerns not answered by these instructions  ######################################################################   DIET Follow a light diet the first few days at home.  Start with a bland diet such as soups, liquids, starchy foods, low fat foods, etc.  If you feel full, bloated, or constipated, stay on a ful liquid or pureed/blenderized diet for a few days until you feel better and no longer constipated. Be sure to drink plenty of fluids every day to avoid getting dehydrated (feeling dizzy, not urinating, etc.). Gradually add a fiber supplement to your diet over the next week.  Gradually get back to a regular solid diet.  Avoid fast food or heavy meals the first week as you are more likely to get nauseated. It is expected for your digestive tract to need a few months to get back to normal.  It is common for your bowel movements and stools to be irregular.  You will have occasional bloating and cramping that should eventually fade away.  Until you are eating solid food normally, off all pain medications, and back to regular activities; your bowels will not be normal. Focus on eating a low-fat, high fiber diet the rest of your life (See Getting to [place], below).  CARE of your INCISION or WOUND It is good for closed incision and even open wounds to be washed every day.  Shower every day.  Short baths are fine.  Wash the incisions and wounds clean with soap & water.    If you have a closed incision(s), wash the incision with soap & water every day.  You may leave closed incisions open to air if it is dry.   You may cover the incision with clean gauze & replace it after your daily shower for  comfort. If you have skin tapes (Steristrips) or skin glue (Dermabond) on your incision, leave them in place.  They will fall off on their own like a scab.  You may trim any edges that curl up with clean scissors.  If you have staples, set up an appointment for them to be removed in the office in 10 days after surgery.  If you have a drain, wash around the skin exit site with soap & water and place a new dressing of gauze or band aid around the skin every day.  Keep the drain site clean & dry.    If you have an open wound with packing, see wound care instructions.  In general, it is encouraged that you remove your dressing and packing, shower with soap & water, and replace your dressing once a day.  Pack the wound with clean gauze moistened with normal (0.9%) saline to keep the wound moist & uninfected.  Pressure on the dressing for 30 minutes will stop most wound bleeding.  Eventually your body will heal & pull the open wound closed over the next few months.  Raw open wounds will occasionally bleed or secrete yellow drainage until it heals closed.  Drain sites will drain a little until the drain is removed.  Even closed incisions can have mild bleeding or drainage the first few days until the skin edges scab over & seal.   If you have an open wound with a wound vac, see wound vac care instructions.     ACTIVITIES as tolerated Start light daily activities --- self-care, walking, climbing stairs-- beginning the day after surgery.  Gradually increase activities as tolerated.  Control your pain to be active.  Stop when you are tired.  Ideally,  walk several times a day, eventually an hour a day.   Most people are back to most day-to-day activities in a few weeks.  It takes 4-8 weeks to get back to unrestricted, intense activity. If you can walk 30 minutes without difficulty, it is safe to try more intense activity such as jogging, treadmill, bicycling, low-impact aerobics, swimming, etc. Save the most  intensive and strenuous activity for last (Usually 4-8 weeks after surgery) such as sit-ups, heavy lifting, contact sports, etc.  Refrain from any intense heavy lifting or straining until you are off narcotics for pain control.  You will have off days, but things should improve week-by-week. DO NOT PUSH THROUGH PAIN.  Let pain be your guide: If it hurts to do something, don't do it.  Pain is your body warning you to avoid that activity for another week until the pain goes down. You may drive when you are no longer taking narcotic prescription pain medication, you can comfortably wear a seatbelt, and you can safely make sudden turns/stops to protect yourself without hesitating due to pain. You may have sexual intercourse when it is comfortable. If it hurts to do something, stop.  MEDICATIONS Take your usually prescribed home medications unless otherwise directed.   Blood thinners:  Usually you can restart any strong blood thinners after the second postoperative day.  It is OK to take aspirin right away.     If you are on strong blood thinners (warfarin/Coumadin, Plavix, Xerelto, Eliquis, Pradaxa, etc), discuss with your surgeon, medicine PCP, and/or cardiologist for instructions on when to restart the blood thinner & if blood monitoring is needed (PT/INR blood check, etc).     PAIN CONTROL Pain after surgery or related to activity is often due to strain/injury to muscle, tendon, nerves and/or incisions.  This pain is usually short-term and will improve in a few months.  To help speed the process of healing and to get back to regular activity more quickly, DO THE FOLLOWING THINGS TOGETHER: 1. Increase activity gradually.  DO NOT PUSH THROUGH PAIN 2. Use Ice and/or Heat 3. Try Gentle Massage and/or Stretching 4. Take over the counter pain medication 5. Take Narcotic prescription pain medication for more severe pain  Good pain control = faster recovery.  It is better to take more medicine to be more  active than to stay in bed all day to avoid medications. 1.  Increase activity gradually Avoid heavy lifting at first, then increase to lifting as tolerated over the next 6 weeks. Do not "push through" the pain.  Listen to your body and avoid positions and maneuvers than reproduce the pain.  Wait a few days before trying something more intense Walking an hour a day is encouraged to help your body recover faster and more safely.  Start slowly and stop when getting sore.  If you can walk 30 minutes without stopping or pain, you can try more intense activity (running, jogging, aerobics, cycling, swimming, treadmill, sex, sports, weightlifting, etc.) Remember: If it hurts to do it, then don't do it! 2. Use Ice and/or Heat You will have swelling and bruising around the incisions.  This will take several weeks to resolve. Ice packs or heating pads (6-8 times a day, 30-60 minutes at a time) will help sooth soreness & bruising. Some people prefer to use ice alone, heat alone, or alternate between ice & heat.  Experiment and see what works best for you.  Consider trying ice for the first few days to help  decrease swelling and bruising; then, switch to heat to help relax sore spots and speed recovery. Shower every day.  Short baths are fine.  It feels good!  Keep the incisions and wounds clean with soap & water.   3. Try Gentle Massage and/or Stretching Massage at the area of pain many times a day Stop if you feel pain - do not overdo it 4. Take over the counter pain medication This helps the muscle and nerve tissues become less irritable and calm down faster Choose ONE of the following over-the-counter anti-inflammatory medications: Acetaminophen 557m tabs (Tylenol) 1-2 pills with every meal and just before bedtime (avoid if you have liver problems or if you have acetaminophen in you narcotic prescription) Naproxen 2231mtabs (ex. Aleve, Naprosyn) 1-2 pills twice a day (avoid if you have kidney, stomach,  IBD, or bleeding problems) Ibuprofen 20034mabs (ex. Advil, Motrin) 3-4 pills with every meal and just before bedtime (avoid if you have kidney, stomach, IBD, or bleeding problems) Take with food/snack several times a day as directed for at least 2 weeks to help keep pain / soreness down & more manageable. 5. Take Narcotic prescription pain medication for more severe pain A prescription for strong pain control is often given to you upon discharge (for example: oxycodone/Percocet, hydrocodone/Norco/Vicodin, or tramadol/Ultram) Take your pain medication as prescribed. Be mindful that most narcotic prescriptions contain Tylenol (acetaminophen) as well - avoid taking too much Tylenol. If you are having problems/concerns with the prescription medicine (does not control pain, nausea, vomiting, rash, itching, etc.), please call us [phone] see if we need to switch you to a different pain medicine that will work better for you and/or control your side effects better. If you need a refill on your pain medication, you must call the office before 4 pm and on weekdays only.  By federal law, prescriptions for narcotics cannot be called into a pharmacy.  They must be filled out on paper & picked up from our office by the patient or authorized caretaker.  Prescriptions cannot be filled after 4 pm nor on weekends.    WHEN TO CALL US [phone] uncontrolled or worsening pain  Fever over 101 F (38.5 C) Concerns with the incision: Worsening pain, redness, rash/hives, swelling, bleeding, or drainage Reactions / problems with new medications (itching, rash, hives, nausea, etc.) Nausea and/or vomiting Difficulty urinating Difficulty breathing Worsening fatigue, dizziness, lightheadedness, blurred vision Other concerns If you are not getting better after two weeks or are noticing you are getting worse, contact our office [phone] for further advice.  We may need to adjust your medications,  re-evaluate you in the office, send you to the emergency room, or see what other things we can do to help. The clinic staff is available to answer your questions during regular business hours (8:30am-5pm).  Please don't hesitate to call and ask to speak to one of our nurses for clinical concerns.    A surgeon from [facility] is always on call at the hospitals 24 hours/day If you have a medical emergency, go to the nearest emergency room or call 911.  FOLLOW UP in our office One the day of your discharge from the hospital (or the next business weekday), please call [facility] to set up or confirm an appointment to see your surgeon in the office for a follow-up appointment.  Usually it is 2-3 weeks after your surgery.   If you have skin staples at your incision(s),  let the office know so we can set up a time in the office for the nurse to remove them (usually around 10 days after surgery). Make sure that you call for appointments the day of discharge (or the next business weekday) from the hospital to ensure a convenient appointment time. IF YOU HAVE DISABILITY OR FAMILY LEAVE FORMS, BRING THEM TO THE OFFICE FOR PROCESSING.  DO NOT GIVE THEM TO YOUR DOCTOR.  [facility] [address], [place], [place], [place]  [address] ? [phone] - Main [phone] - [place],  [phone] - Fax [email or web address]  GETTING TO GOOD BOWEL HEALTH. It is expected for your digestive tract to need a few months to get back to normal.  It is common for your bowel movements and stools to be irregular.  You will have occasional bloating and cramping that should eventually fade away.  Until you are eating solid food normally, off all pain medications, and back to regular activities; your bowels will not be normal.   Avoiding constipation The goal: ONE SOFT BOWEL MOVEMENT A DAY!    Drink plenty of fluids.  Choose water first. TAKE A FIBER  SUPPLEMENT EVERY DAY THE REST OF YOUR LIFE During your first week back home, gradually add back a fiber supplement every day Experiment which form you can tolerate.   There are many forms such as powders, tablets, wafers, gummies, etc Psyllium bran (Metamucil), methylcellulose (Citrucel), Miralax or Glycolax, Benefiber, Flax Seed.  Adjust the dose week-by-week (1/2 dose/day to 6 doses a day) until you are moving your bowels 1-2 times a day.  Cut back the dose or try a different fiber product if it is giving you problems such as diarrhea or bloating. Sometimes a laxative is needed to help jump-start bowels if constipated until the fiber supplement can help regulate your bowels.  If you are tolerating eating & you are farting, it is okay to try a gentle laxative such as double dose MiraLax, prune juice, or Milk of Magnesia.  Avoid using laxatives too often. Stool softeners can sometimes help counteract the constipating effects of narcotic pain medicines.  It can also cause diarrhea, so avoid using for too long. If you are still constipated despite taking fiber daily, eating solids, and a few doses of laxatives, call our office. Controlling diarrhea Try drinking liquids and eating bland foods for a few days to avoid stressing your intestines further. Avoid dairy products (especially milk & ice cream) for a short time.  The intestines often can lose the ability to digest lactose when stressed. Avoid foods that cause gassiness or bloating.  Typical foods include beans and other legumes, cabbage, broccoli, and dairy foods.  Avoid greasy, spicy, fast foods.  Every person has some sensitivity to other foods, so listen to your body and avoid those foods that trigger problems for you. Probiotics (such as active yogurt, Align, etc) may help repopulate the intestines and colon with normal bacteria and calm down a sensitive digestive tract Adding a fiber supplement gradually can help thicken stools by absorbing  excess fluid and retrain the intestines to act more normally.  Slowly increase the dose over a few weeks.  Too much fiber too soon can backfire and cause cramping & bloating. It is okay to try and slow down diarrhea with a few doses of antidiarrheal medicines.   Bismuth subsalicylate (ex. Kayopectate, Pepto Bismol) for a few doses can help control diarrhea.  Avoid if pregnant.   Loperamide (Imodium)  can slow down diarrhea.  Start with one tablet (95m) first.  Avoid if you are having fevers or severe pain.  ILEOSTOMY PATIENTS WILL HAVE CHRONIC DIARRHEA since their colon is not in use.    Drink plenty of liquids.  You will need to drink even more glasses of water/liquid a day to avoid getting dehydrated. Record output from your ileostomy.  Expect to empty the bag every 3-4 hours at first.  Most people with a permanent ileostomy empty their bag 4-6 times at the least.   Use antidiarrheal medicine (especially Imodium) several times a day to avoid getting dehydrated.  Start with a dose at bedtime & breakfast.  Adjust up or down as needed.  Increase antidiarrheal medications as directed to avoid emptying the bag more than 8 times a day (every 3 hours). Work with your wound ostomy nurse to learn care for your ostomy.  See ostomy care instructions. TROUBLESHOOTING IRREGULAR BOWELS 1) Start with a soft & bland diet. No spicy, greasy, or fried foods.  2) Avoid gluten/wheat or dairy products from diet to see if symptoms improve. 3) Miralax 17gm or flax seed mixed in 8Icard water or juice-daily. May use 2-4 times a day as needed. 4) Gas-X, Phazyme, etc. as needed for gas & bloating.  5) Prilosec (omeprazole) over-the-counter as needed 6)  Consider probiotics (Align, Activa, etc) to help calm the bowels down  Call your doctor if you are getting worse or not getting better.  Sometimes further testing (cultures, endoscopy, X-ray studies, CT scans, bloodwork, etc.) may be needed to help diagnose and treat the cause  of the diarrhea. [facility] [place]  [phone]- Main.    [phone] - Toll Free.   [phone] Fax [email or web address]  .........Marland Kitchen  Managing Your Pain After Surgery Without Opioids    Thank you for participating in our program to help patients manage their pain after surgery without opioids. This is part of our effort to provide you with the best care possible, without exposing you or your family to the risk that opioids pose.  What pain can I expect after surgery? You can expect to have some pain after surgery. This is normal. The pain is typically worse the day after surgery, and quickly begins to get better. Many studies have found that many patients are able to manage their pain after surgery with Over-the-Counter (OTC) medications such as Tylenol and Motrin. If you have a condition that does not allow you to take Tylenol or Motrin, notify your surgical team.  How will I manage my pain? The best strategy for controlling your pain after surgery is around the clock pain control with Tylenol (acetaminophen) and Motrin (ibuprofen or Advil). Alternating these medications with each other allows you to maximize your pain control. In addition to Tylenol and Motrin, you can use heating pads or ice packs on your incisions to help reduce your pain.  How will I alternate your regular strength over-the-counter pain medication? You will take a dose of pain medication every three hours. ; Start by taking 650 mg of Tylenol (2 pills of 325 mg) ; 3 hours later take 600 mg of Motrin (3 pills of 200 mg) ; 3 hours after taking the Motrin take 650 mg of Tylenol ; 3 hours after that take 600 mg of Motrin.   - 1 -  See example - if your first dose of Tylenol is at 12:00  PM   12:00 PM Tylenol 650 mg (2 pills of 325 mg)  3:00 PM Motrin 600 mg (3 pills of 200 mg)  6:00 PM Tylenol 650 mg (2 pills of 325 mg)   9:00 PM Motrin 600 mg (3 pills of 200 mg)  Continue alternating every 3 hours   We recommend that you follow this schedule around-the-clock for at least 3 days after surgery, or until you feel that it is no longer needed. Use the table on the last page of this handout to keep track of the medications you are taking. Important: Do not take more than 3085m of Tylenol or 32074mof Motrin in a 24-hour period. Do not take ibuprofen/Motrin if you have a history of bleeding stomach ulcers, severe kidney disease, &/or actively taking a blood thinner  What if I still have pain? If you have pain that is not controlled with the over-the-counter pain medications (Tylenol and Motrin or Advil) you might have what we call "breakthrough" pain. You will receive a prescription for a small amount of an opioid pain medication such as Oxycodone, Tramadol, or Tylenol with Codeine. Use these opioid pills in the first 24 hours after surgery if you have breakthrough pain. Do not take more than 1 pill every 4-6 hours.  If you still have uncontrolled pain after using all opioid pills, don't hesitate to call our staff using the number provided. We will help make sure you are managing your pain in the best way possible, and if necessary, we can provide a prescription for additional pain medication.   Day 1    Time  Name of Medication Number of pills taken  Amount of Acetaminophen  Pain Level   Comments  AM PM       AM PM       AM PM       AM PM       AM PM       AM PM       AM PM       AM PM       Total Daily amount of Acetaminophen Do not take more than  3,000 mg per day      Day 2    Time  Name of Medication Number of pills taken  Amount of Acetaminophen  Pain Level   Comments  AM PM       AM PM       AM PM       AM PM       AM PM       AM PM       AM PM       AM PM       Total Daily amount of Acetaminophen Do not take more than  3,000 mg per day      Day 3    Time  Name of  Medication Number of pills taken  Amount of Acetaminophen  Pain Level   Comments  AM PM       AM PM       AM PM       AM PM          AM PM       AM PM       AM PM       AM PM       Total Daily amount of Acetaminophen Do not take more than  3,000 mg per day  Day 4    Time  Name of Medication Number of pills taken  Amount of Acetaminophen  Pain Level   Comments  AM PM       AM PM       AM PM       AM PM       AM PM       AM PM       AM PM       AM PM       Total Daily amount of Acetaminophen Do not take more than  3,000 mg per day      Day 5    Time  Name of Medication Number of pills taken  Amount of Acetaminophen  Pain Level   Comments  AM PM       AM PM       AM PM       AM PM       AM PM       AM PM       AM PM       AM PM       Total Daily amount of Acetaminophen Do not take more than  3,000 mg per day       Day 6    Time  Name of Medication Number of pills taken  Amount of Acetaminophen  Pain Level  Comments  AM PM       AM PM       AM PM       AM PM       AM PM       AM PM       AM PM       AM PM       Total Daily amount of Acetaminophen Do not take more than  3,000 mg per day      Day 7    Time  Name of Medication Number of pills taken  Amount of Acetaminophen  Pain Level   Comments  AM PM       AM PM       AM PM       AM PM       AM PM       AM PM       AM PM       AM PM       Total Daily amount of Acetaminophen Do not take more than  3,000 mg per day        For additional information about how and where to safely dispose of unused opioid medications - RoleLink.com.br  Disclaimer: This document contains information and/or instructional materials adapted from [place] for the typical patient with your condition. It does not replace medical advice from your health care provider because your experience may differ from that of the typical patient. Talk to your health care  provider if you have any questions about this document, your condition or your treatment plan. Adapted from [place] for Eating Away From Home If You Have Diabetes Controlling your blood sugar (glucose) levels can be challenging when you do not prepare your own meals. The following tips can help you manage your diabetes when you eat away from home. If you have questions or if you need help, work with your health care provider or diet and nutrition specialist (dietitian). Planning ahead Plan ahead if you know you will be eating away from home:  Try to eat your meals and snacks at about the same time each day. If you know your meal is going to be later than normal, make sure you have a small snack. Being very hungry can cause you to make unhealthy food choices.  Make a list of restaurants near you that offer healthy choices. If a restaurant has a carry-out menu, take the menu home and plan what you will order ahead of time.  Look up the restaurant you want to eat at online. Many chain and fast-food restaurants list nutritional information online. Use this information to choose the healthiest options and to calculate how many carbohydrates will be in your meal.  Use a carbohydrate-counting book or mobile app to look up the carbohydrate content and serving size of the foods you want to eat. Free foods A "free food" is any food or drink that has less than 5 grams of carbohydrates and less than 20 calories per serving. These food are high in fiber and nutrients and low in calories, carbohydrates, and fats. Free foods include:  Non-starchy vegetables, such as carrots, broccoli, celery, lettuce, or green beans.  Non-sugar drinks, such as water, unsweetened coffee, or unsweetened tea.  Low-calorie salad dressings.  Sugar-free gelatin. Starting meals with a salad full of vegetables is a healthy choice that includes a lot of free foods. Avoid high-calorie salad toppings like bacon,  cheese, and high-fat dressings. Ask for your salad dressing to be served on the side so that you dip your fork in the dressing and then in the salad. This allows you to control how much dressing you eat and still get the flavor with every bite. Choices to control carbohydrates   Ask your server to take away the bread basket or chips from your table.  Choose light yogurt or [place] yogurt instead of non-fat sweetened yogurt.  Order fresh fruit. A salad bar often offers fresh fruit choices. Avoid canned fruit because it is usually packed in sugar or syrup.  Order a salad, and ask for dressing on the side.  Ask for substitutes. For example, if your meal comes with french fries, ask for a side salad or steamed veggies instead. If a meal comes with fried chicken, ask for grilled chicken instead. Beverages  Choose drinks that are low in calories and sugar, such as: ? Water. ? Unsweetened tea or coffee. ? Lowfat milk.  Avoid the following drinks: ? Alcoholic beverages. ? Regular (not diet) sodas. Other tips  If you take insulin, wait to take your insulin once your food arrives to your table. This will ensure that your insulin and your food are timed correctly.  Become familiar with serving sizes and learn to recognize how many servings are in a portion. Restaurant portions are typically two to three times larger than what you really need.  Ask your server for a to-go box at the beginning of the meal. When your food comes, leave the amount you should have on your plate, and put the rest in the to-go box so that you are not tempted to eat too much.  Consider splitting an entree with someone and ordering a side salad.  Avoid buffets. They are typically too tempting and result in overeating. Where to find more information  American Diabetes Association: www.diabetes.org  American Association of Diabetes Educators: www.diabeteseducator.org Summary  Plan ahead when eating away from  home.  Try to eat your meals and snacks at about the same time each day. If you know your meal  is going to be later than normal, make sure you have a small snack. Being very hungry can cause you to make unhealthy food choices.  Ask for substitutes. For example, if your meal comes with french fries, ask for a side salad or steamed veggies instead. If a meal comes with fried chicken, ask for grilled chicken instead.  Ask for a to-go box when you order your meal. Divide your meal before you start eating. This information is not intended to replace advice given to you by your health care provider. Make sure you discuss any questions you have with your health care provider. Document Revised: 03/02/2017 Document Reviewed: 03/02/2017 Elsevier Patient Education  2020 [employer].   Surveyor, minerals Colorectal Cancer  El cncer colorrectal es un crecimiento anormal de clulas y tejido (tumor) en el colon o el recto, que son partes del intestino grueso. El cncer se puede propagar Chartered loss adjuster) a Airline pilot del cuerpo. Cules son las causas? En la [employer], el cncer colorrectal comienza con crecimientos anormales llamados plipos en la pared interna del colon o el recto. En otras ocasiones, los cambios [employer] genes (mutaciones genticas) pueden causar que las clulas formen cncer. Qu incrementa el riesgo? Es ms probable que usted sufra esta afeccin si:  Es mayor de 81XBJ.  Tiene mltiples plipos en el colon o el recto.  Tiene diabetes.  Es afroamericano.  Tiene antecedentes familiares de sndrome de [place].  Ya ha tenido cncer.  Tiene determinadas afecciones hereditarias, por ejemplo: ? Poliposis adenomatosa familiar. ? Sndrome de [place]. ? Sndrome de Peutz-Jeghers.  Consume una dieta que es alta en grasa (especialmente grasa animal) y baja en fibra, frutas y verduras.  Tiene un estilo de vida inactivo (sedentario).  Tiene una enfermedad  inflamatoria del intestino o enfermedad de Crohn.  Fuma.  Bebe alcohol en exceso. Cules son los signos o sntomas? El cncer colorrectal, generalmente, no causa sntomas. A medida que el cncer avanza, los sntomas pueden incluir los siguientes:  Cambios en los hbitos intestinales.  Sentir que el intestino no se vaca completamente despus de defecar.  Heces ms estrechas que lo habitual.  Sangre en las heces.  Diarrea.  Estreimiento.  Anemia.  Molestias, dolor, distensin, sensacin de estar lleno o clicos en el abdomen.  Dolor frecuente por meteorismo.  Prdida de peso sin causa aparente.  Fatiga constante.  Nuseas y vmitos. Cmo se diagnostica? Esta afeccin se puede diagnosticar con lo siguiente:  Los antecedentes mdicos.  Un examen fsico.  Estudios. Estos pueden incluir: ? Examen rectal digital. ? Un anlisis de heces llamado anlisis de sangre oculta en heces. ? Anlisis de [place]. ? Un estudio en el cual se toma Truddie Coco de tejido del colon o del recto y se examina con un microscopio (biopsia). ? Pruebas de diagnstico por imgenes, por ejemplo:  Radiografas.  Enema de bario.  Exploraciones por tomografa computarizada (TC).  Resonancias magnticas (RM).  Sigmoidoscopia. Este estudio se lleva a cabo para observar el interior del recto.  Colonoscopia. Este estudio se lleva a cabo para observar el interior del colon. [employer], se pueden extirpar los plipos pequeos o Optometrist biopsias.  Ecografa endorrectal. Este estudio verifica cunto ha crecido un tumor en el recto y si el cncer se ha diseminado a los ganglios linfticos o a otros tejidos cercanos. El mdico puede solicitar estudios adicionales para determinar si el cncer se ha diseminado a otras partes del cuerpo (en qu estadio se encuentra). Los estadios  del cncer incluyen los siguientes:  Estadio 0. En este estadio, el cncer se encuentra solo en el revestimiento  ms interno del colon o del recto.  Estadio I. En este estadio, el cncer se ha diseminado a la pared interna del colon o del recto.  Estadio II. En este estadio, el cncer se ha extendido ms profundamente a la pared del colon o del recto, o a travs de la pared. Puede haber invadido el tejido cercano.  Estadio III. En este estadio, el cncer se ha extendido a los ganglios linfticos cercanos.  Estadio IV. En este estadio, el cncer se ha extendido a otras partes del cuerpo, como el hgado o los pulmones. Cmo se trata? El tratamiento de esta afeccin depende del tipo y del estadio del cncer. El tratamiento puede incluir lo siguiente:  Clementeen Hoof. En los primeros estadios del cncer, se puede realizar una ciruga para extirpar los plipos o pequeos tumores del colon. En estadios posteriores, se puede realizar [place] para extirpar parte del colon.  Quimioterapia. Este tratamiento utiliza medicamentos para destruir clulas cancerosas.  Terapia dirigida. El tratamiento est dirigido a mutaciones de genes especficos o protenas que el cncer exterioriza para destruir las clulas del tumor.  Radioterapia. En este tratamiento, se utiliza radiacin para destruir las clulas cancerosas o reducir los tumores.  Ablacin por radiofrecuencia. En [employer], se utilizan ondas de radio para [employer] tumores que puedan haberse propagado a otras reas del cuerpo, como el hgado. Siga estas indicaciones en su casa:  Tome los medicamentos de venta libre y los recetados solamente como se lo haya indicado el mdico.  Coma alimentos saludables de [place] regular. Algunos tratamientos podran afectar su apetito. Si tiene problemas para comer o de apetito, solicite una cita con un especialista en nutricin o alimentacin (nutricionista).  Considere la posibilidad de Chief Financial Officer en un grupo de apoyo. Esto puede ayudarlo a obtener informacin sobre su diagnstico y a Animal nutritionist de  Photographer.  Si lo internan en el hospital, informe a su equipo de Freight forwarder.  Concurra a todas las visitas de control como se lo haya indicado el mdico. Esto es importante. Cmo se evita?  El Surveyor, minerals se puede prevenir con pruebas de deteccin que descubren plipos para que puedan ser extirpados antes de que se conviertan en cncer.  Todos los adultos a Proofreader de los 75 aos y [employer] 5 aos deben hacerse pruebas de Programme researcher, broadcasting/film/video de Surveyor, minerals. El mdico puede recomendarle las pruebas de deteccin a partir de los 46 aos. [place] [employer] tienen un mayor riesgo deben comenzar con las pruebas de deteccin a una edad ms temprana. Dnde buscar ms informacin  Sociedad Museum/gallery conservator ([place]): https://www.cancer.[place] ([place]): https://www.cancer.gov Comunquese con un mdico si:  La diarrea o el estreimiento persisten.  Observa sangre en las heces.  Sus hbitos intestinales han [place].  Siente ms dolor en el abdomen.  Nota que se siente ms cansado o dbil.  Pierde peso. Solicite ayuda de inmediato si:  Observa un aumento del sangrado en el recto.  Tiene sntomas en el abdomen (abdominales) que son graves o no IT consultant. Resumen  El cncer colorrectal es un crecimiento anormal de clulas y tejido (tumor) en el colon o el recto.  Los factores de riesgo comunes de esta afeccin incluyen tener un familiar que haya sufrido cncer de colon, tener una edad avanzada, sufrir una enfermedad inflamatoria  del intestino y ser afroamericano.  Esta afeccin se puede diagnosticar mediante estudios, como una colonoscopa y Ardelia Mems biopsia.  El tratamiento depende del tipo y el estadio del cncer. Normalmente, el tratamiento incluye una ciruga para extraer el tumor junto con quimioterapia o terapia dirigida.  Concurra a todas las visitas de control como se lo  haya indicado el mdico. Esto es importante. Esta informacin no tiene Marine scientist el consejo del mdico. Asegrese de hacerle al mdico cualquier pregunta que tenga. Document Revised: 03/04/2018 Document Reviewed: 03/14/2017 Elsevier Patient Education  [place].

## 2020-01-03 NOTE — Plan of Care (Signed)

## 2020-01-07 NOTE — Progress Notes (Signed)
Received this referral today. Made attempts x 2 to reach patient. No voicemail to leave message.  Will attempt again tomorrow.

## 2020-01-09 DIAGNOSIS — C187 Malignant neoplasm of sigmoid colon: Secondary | ICD-10-CM

## 2020-01-09 NOTE — Progress Notes (Signed)
Made several phone attempts to contact patient on 01/08/20 which were unsuccessful.   This morning, and e-mail was sent to the e-mail on file requesting a call.   [id number] - Received a call from Ulice Dash, patient's son. He states the number in our system is old. Number is updated to new number received from son.   Introduced myself as the Loss adjuster, chartered and explain our new patient process. Reviewed the reason for their referral and scheduled their new patient appointment along with labs. Will email address and directions to the office including call back phone number.  Informed patient about my role as a navigator and that I will meet with them prior to their New Patient appointment and more fully discuss what services I can provide. At this time patient has no further questions or needs.
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
## 2020-01-10 DIAGNOSIS — D473 Essential (hemorrhagic) thrombocythemia: Secondary | ICD-10-CM

## 2020-01-10 DIAGNOSIS — C187 Malignant neoplasm of sigmoid colon: Secondary | ICD-10-CM

## 2020-01-10 DIAGNOSIS — E875 Hyperkalemia: Secondary | ICD-10-CM | POA: Insufficient documentation

## 2020-01-10 DIAGNOSIS — D509 Iron deficiency anemia, unspecified: Secondary | ICD-10-CM | POA: Insufficient documentation

## 2020-01-10 DIAGNOSIS — D75839 Thrombocytosis, unspecified: Secondary | ICD-10-CM

## 2020-01-10 NOTE — Progress Notes (Signed)
Initial RN Navigator Patient Visit  Name: Dakota Washington Date of Referral : 01/07/2020  Diagnosis: Cancer of sigmoid colon   Met with patient prior to their visit with MD. Hanley Seamen patient "Your Patient Navigator" handout which explains my role, areas in which I am able to help, and all the contact information for myself and the office. Also gave patient MD and Navigator business card. Reviewed with patient the general overview of expected course after initial diagnosis and time frame for all steps to be completed.  Patient was seen with his daughter Dakota Washington (pronounced Evelena Peat). She and her brother Dakota Washington are sharing responsibility for caring for their father. Patient requests I call Dakota Washington and coordinate appointments with her. Her contact information updated in chart.  New patient packet given to patient which includes: orientation to office and staff; campus directory; education on My Chart and Advance Directives; and patient centered education on colorectal cancer.   Patient completed visit with Dr. Maylon Peppers  Revisited with patient after MD visit. Patient will need  CT chest - scheduled for 01/15/20 Port Placement - scheduled for 02/01/20 Chemo Education - scheduled for 01/15/20 Nutritional Consult - scheduled for 01/29/20 First Treatment - Scheduled for 02/07/20  Called and reviewed appointments, including time, date and location. Also reviewed appointment prep as needed. Calendar with written instructions also mailed to patient home.   Patient understands all follow up procedures and expectations. They have my number to reach out for any further clarification or additional needs.

## 2020-01-10 NOTE — Telephone Encounter (Signed)
Appointments scheduled and a calendar was printed.  I also took patient up to speak w/ Otilio Carpen regarding No Insurance per 2/4 los

## 2020-01-10 NOTE — Progress Notes (Signed)
START ON PATHWAY REGIMEN - Colorectal     A cycle is every 21 days:     Capecitabine      Oxaliplatin   **Always confirm dose/schedule in your pharmacy ordering system**  Patient Characteristics: Postoperative without Neoadjuvant Therapy (Pathologic Staging), Colon, Stage III, High Risk (pT4 or pN2) Tumor Location: Colon Therapeutic Status: Postoperative without Neoadjuvant Therapy (Pathologic Staging) AJCC M Category: cM0 AJCC T Category: pT4a AJCC N Category: pN1c AJCC 8 Stage Grouping: IIIB Intent of Therapy: Curative Intent, Discussed with Patient

## 2020-01-15 DIAGNOSIS — C187 Malignant neoplasm of sigmoid colon: Secondary | ICD-10-CM

## 2020-01-15 DIAGNOSIS — D509 Iron deficiency anemia, unspecified: Secondary | ICD-10-CM

## 2020-01-15 NOTE — Progress Notes (Signed)
64 year old male diagnosed with Colon cancer to receive CAPOX s/p resection.  He is a patient of Dr. Maylon Peppers.  Medications include Xeloda, decadron, glucotrol, lantus, glucophage, zofran, compazine, Kayexalate.  Labs include: K 5.5 and Glucose 185.  Height: 5'5". Weight: 147.8 pounds. BMI: 24.6  Patient is here for nutrition education.  Nutrition Diagnosis: Food and Nutrition Related Knowledge Deficit related to cancer diagnosis and associated treatments as evidenced by no prior need for nutrition related information.  Intervention: Educated patient to consume healthy diet consisting of whole grains, whole fruits and vegetables along with lean proteins. Educated to avoid concentrated sweets. Recommended small, frequent meals and snacks. Provided samples of oral nutrition supplements and coupons. Encouraged adequate fluid intake. Provided fact sheets and contact information. Questions answered and teach back method used.  Monitoring, Evaluation, Goals: Patient will tolerate adequate calories and protein to minimize weight loss and achieve adequate glycemic control.  Next Visit: Patient has my contact information for questions.

## 2020-01-15 NOTE — Progress Notes (Signed)
Patient here for iron and chemo education. We had a cancellation and we were able to have patient complete nutrition consult while here. His appointment for next week was cancelled. Reviewed new calendar with patient's daughter, Theda Belfast. Answered her questions.  She knows to reach out to me as needed.

## 2020-01-15 NOTE — Progress Notes (Signed)
Patient in chemotherapy education class with  Daughter who speaks [place].  Patient does not speak [place].  Discussed side effects of Oxaliplatin and Capecitabine  which include but are not limited to myelosuppression, decreased appetite, fatigue, fever, allergic or infusional reaction, mucositis, cardiac toxicity, cough, SOB, altered taste, nausea and vomiting, diarrhea, constipation, elevated LFTs myalgia and arthralgias, hair loss or thinning, rash, skin dryness, nail changes, peripheral neuropathy, , delayed wound healing, mental changes (Chemo brain), increased risk of infections, weight loss.  Reviewed infusion room and office policy and procedure and phone numbers 24 hours x 7 days a week.   Reviewed when to call the office with any concerns or problems.  Scientist, clinical (histocompatibility and immunogenetics) given.  Discussed portacath insertion and EMLA cream administration.  Antiemetic protocol and chemotherapy schedule reviewed. Patient and daughter verbalized understanding of chemotherapy indications and possible side effects.  Teachback done

## 2020-01-17 NOTE — Telephone Encounter (Signed)
Oral Oncology Pharmacist Encounter  Received new prescription for Xeloda (capecitabine) for the adjuvant treatment of stage IIIB colon cancer in conjunction with oxaliplatin, planned duration 8 cycles. Planned start 02/07/20  CMP from 01/10/20 assessed, no relevant lab abnormalities. Prescription dose and frequency assessed.   Current medication list in Epic reviewed, no relevant DDIs with capecitabine identified.  Prescription has been e-scribed to the [facility] for benefits analysis and approval.  Oral Oncology Clinic will continue to follow for insurance authorization, copayment issues, initial counseling and start date.  Darl Pikes, PharmD, BCPS, [facility] Hematology/Oncology Clinical Pharmacist [facility] [phone]  01/17/2020 12:11 PM

## 2020-01-17 NOTE — Telephone Encounter (Signed)
Oral Oncology Patient Advocate Encounter  Called and spoke to patients son, Ulice Dash, to complete online application for the [place] in an effort to reduce patient's out of pocket expense for Xeloda to $0.      HCP portion of application was faxed to [phone].   [place] phone number for follow up is [phone].   This encounter will be updated until final determination.   [place] Patient [place] Phone [phone] Fax [phone] 01/17/2020 11:31 AM

## 2020-01-21 NOTE — Telephone Encounter (Signed)
Oral Oncology Patient Advocate Encounter  Received notification from [facility] Patient Assistance program that patient has been successfully enrolled into their program to receive Xeloda from the manufacturer at $0 out of pocket.  Patient will receive medication until: patient no longer on therapy, insurance changes, or no longer meets eligibility criteria. No end date indicated.   I called and spoke with patients son, Ulice Dash.    Patient knows to call the office with questions or concerns.   Oral Oncology Clinic will continue to follow.  [place] Patient [place] Phone [phone] Fax [phone] 01/21/2020 8:30 AM
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
## 2020-01-28 DIAGNOSIS — C187 Malignant neoplasm of sigmoid colon: Secondary | ICD-10-CM

## 2020-01-28 DIAGNOSIS — D509 Iron deficiency anemia, unspecified: Secondary | ICD-10-CM

## 2020-01-28 NOTE — Patient Instructions (Signed)

## 2020-01-30 NOTE — Progress Notes (Signed)
Patient has been approved for drug assistance by [employer] for Feraheme. The enrollment period is from 01/15/20-01/14/21 based on self pay. First DOS covered is 01/15/20.

## 2020-02-01 DIAGNOSIS — Z794 Long term (current) use of insulin: Secondary | ICD-10-CM | POA: Insufficient documentation

## 2020-02-01 DIAGNOSIS — E119 Type 2 diabetes mellitus without complications: Secondary | ICD-10-CM | POA: Insufficient documentation

## 2020-02-01 DIAGNOSIS — C187 Malignant neoplasm of sigmoid colon: Secondary | ICD-10-CM

## 2020-02-01 DIAGNOSIS — Z79899 Other long term (current) drug therapy: Secondary | ICD-10-CM | POA: Insufficient documentation

## 2020-02-01 HISTORY — PX: IR IMAGING GUIDED PORT INSERTION: IMG5740

## 2020-02-01 NOTE — Discharge Instructions (Signed)
Moderate Conscious Sedation, Adult, Care After These instructions provide you with information about caring for yourself after your procedure. Your health care provider may also give you more specific instructions. Your treatment has been planned according to current medical practices, but problems sometimes occur. Call your health care provider if you have any problems or questions after your procedure. What can I expect after the procedure? After your procedure, it is common:  To feel sleepy for several hours.  To feel clumsy and have poor balance for several hours.  To have poor judgment for several hours.  To vomit if you eat too soon. Follow these instructions at home: For at least 24 hours after the procedure:   Do not: ? Participate in activities where you could fall or become injured. ? Drive. ? Use heavy machinery. ? Drink alcohol. ? Take sleeping pills or medicines that cause drowsiness. ? Make important decisions or sign legal documents. ? Take care of children on your own.  Rest. Eating and drinking  Follow the diet recommended by your health care provider.  If you vomit: ? Drink water, juice, or soup when you can drink without vomiting. ? Make sure you have little or no nausea before eating solid foods. General instructions  Have a responsible adult stay with you until you are awake and alert.  Take over-the-counter and prescription medicines only as told by your health care provider.  If you smoke, do not smoke without supervision.  Keep all follow-up visits as told by your health care provider. This is important. Contact a health care provider if:  You keep feeling nauseous or you keep vomiting.  You feel light-headed.  You develop a rash.  You have a fever. Get help right away if:  You have trouble breathing. This information is not intended to replace advice given to you by your health care provider. Make sure you discuss any questions you have  with your health care provider. Document Revised: 11/04/2017 Document Reviewed: 03/13/2016 Elsevier Patient Education  2020 Elsevier Inc. Implanted Port Home Guide An implanted port is a device that is placed under the skin. It is usually placed in the chest. The device can be used to give IV medicine, to take blood, or for dialysis. You may have an implanted port if:  You need IV medicine that would be irritating to the small veins in your hands or arms.  You need IV medicines, such as antibiotics, for a long period of time.  You need IV nutrition for a long period of time.  You need dialysis. Having a port means that your health care provider will not need to use the veins in your arms for these procedures. You may have fewer limitations when using a port than you would if you used other types of long-term IVs, and you will likely be able to return to normal activities after your incision heals. An implanted port has two main parts:  Reservoir. The reservoir is the part where a needle is inserted to give medicines or draw blood. The reservoir is round. After it is placed, it appears as a small, raised area under your skin.  Catheter. The catheter is a thin, flexible tube that connects the reservoir to a vein. Medicine that is inserted into the reservoir goes into the catheter and then into the vein. How is my port accessed? To access your port:  A numbing cream may be placed on the skin over the port site.  Your health care provider   will put on a mask and sterile gloves.  The skin over your port will be cleaned carefully with a germ-killing soap and allowed to dry.  Your health care provider will gently pinch the port and insert a needle into it.  Your health care provider will check for a blood return to make sure the port is in the vein and is not clogged.  If your port needs to remain accessed to get medicine continuously (constant infusion), your health care provider will place  a clear bandage (dressing) over the needle site. The dressing and needle will need to be changed every week, or as told by your health care provider. What is flushing? Flushing helps keep the port from getting clogged. Follow instructions from your health care provider about how and when to flush the port. Ports are usually flushed with saline solution or a medicine called heparin. The need for flushing will depend on how the port is used:  If the port is only used from time to time to give medicines or draw blood, the port may need to be flushed: ? Before and after medicines have been given. ? Before and after blood has been drawn. ? As part of routine maintenance. Flushing may be recommended every 4-6 weeks.  If a constant infusion is running, the port may not need to be flushed.  Throw away any syringes in a disposal container that is meant for sharp items (sharps container). You can buy a sharps container from a pharmacy, or you can make one by using an empty hard plastic bottle with a cover. How long will my port stay implanted? The port can stay in for as long as your health care provider thinks it is needed. When it is time for the port to come out, a surgery will be done to remove it. The surgery will be similar to the procedure that was done to put the port in. Follow these instructions at home:   Flush your port as told by your health care provider.  If you need an infusion over several days, follow instructions from your health care provider about how to take care of your port site. Make sure you: ? Wash your hands with soap and water before you change your dressing. If soap and water are not available, use alcohol-based hand sanitizer. ? Change your dressing as told by your health care provider. ? Place any used dressings or infusion bags into a plastic bag. Throw that bag in the trash. ? Keep the dressing that covers the needle clean and dry. Do not get it wet. ? Do not use  scissors or sharp objects near the tube. ? Keep the tube clamped, unless it is being used.  Check your port site every day for signs of infection. Check for: ? Redness, swelling, or pain. ? Fluid or blood. ? Pus or a bad smell.  Protect the skin around the port site. ? Avoid wearing bra straps that rub or irritate the site. ? Protect the skin around your port from seat belts. Place a soft pad over your chest if needed.  Bathe or shower as told by your health care provider. The site may get wet as long as you are not actively receiving an infusion.  Return to your normal activities as told by your health care provider. Ask your health care provider what activities are safe for you.  Carry a medical alert card or wear a medical alert bracelet at all times. This will   let health care providers know that you have an implanted port in case of an emergency. Get help right away if:  You have redness, swelling, or pain at the port site.  You have fluid or blood coming from your port site.  You have pus or a bad smell coming from the port site.  You have a fever. Summary  Implanted ports are usually placed in the chest for long-term IV access.  Follow instructions from your health care provider about flushing the port and changing bandages (dressings).  Take care of the area around your port by avoiding clothing that puts pressure on the area, and by watching for signs of infection.  Protect the skin around your port from seat belts. Place a soft pad over your chest if needed.  Get help right away if you have a fever or you have redness, swelling, pain, drainage, or a bad smell at the port site. This information is not intended to replace advice given to you by your health care provider. Make sure you discuss any questions you have with your health care provider. Document Revised: 03/16/2019 Document Reviewed: 12/25/2016 Elsevier Patient Education  2020 Elsevier Inc.  

## 2020-02-01 NOTE — Procedures (Signed)
Interventional Radiology Procedure Note  Procedure: Placement of a right IJ approach single lumen PowerPort.  Tip is positioned at the superior cavoatrial junction and catheter is ready for immediate use.  Complications: No immediate Recommendations:  - Ok to shower tomorrow - Do not submerge for 7 days - Routine line care   Signed,  Mandy Fitzwater K. Kamalani Mastro, MD   

## 2020-02-06 DIAGNOSIS — C187 Malignant neoplasm of sigmoid colon: Secondary | ICD-10-CM

## 2020-02-06 NOTE — Telephone Encounter (Signed)
Oral Chemotherapy Pharmacist Encounter  Patient was signed up for free Xeloda through [facility] patient assistance but his shipment was delayed. [facility] will fill the Xeloda and the patient's son plans on picking up the medication today ahead of his planned start tomorrow 02/07/20.  Patient Education I spoke with patient's son Dakota Washington for overview of new oral chemotherapy medication:Xeloda (capecitabine) for the adjuvant treatment of stage IIIB colon cancer in conjunction with oxaliplatin, planned duration 8 cycles. Planned start 02/07/20.   Counseled Jay on administration, dosing, side effects, monitoring, drug-food interactions, safe handling, storage, and disposal. Patient will take 1,500 mg by mouth 2 (two) times daily after a meal. Take for 14 days, then hold for 7 days. Repeat every 21 days.  Side effects include but not limited to: diarrhea, hand-foot syndrome, N/V, fatigue, edema, decreased wbc.    Reviewed the importance of keeping a medication schedule and plan for any missed doses.  Dakota Washington voiced understanding and appreciation. All questions answered. Medication handout placed in the mail.  Provided patient with [facility] phone number. Patient knows to call the office with questions or concerns. Oral Chemotherapy Navigation Clinic will continue to follow.  Darl Pikes, PharmD, BCPS, [facility] Hematology/Oncology Clinical Pharmacist [facility] [place] [facility] [phone]  02/06/2020 11:33 AM

## 2020-02-06 NOTE — Telephone Encounter (Signed)
Called to verify delivery of medication from [facility] pharmacy.  They did not have any shipments scheduled for patient.  Earliest date that patient would receive medication is 3/11.  Patient is to start Xeloda tomorrow.  He has a Radio producer through the [facility] Patient Emergency fund and we were able to use some funds from that to fill the medication at [facility].  Patient's son Ulice Dash will pick up the medication this afternoon for the patient to bring to his appointment tomorrow, 02/07/20.  [place] Patient [place] Phone [phone] Fax [phone] 02/06/2020 11:22 AM

## 2020-02-07 DIAGNOSIS — Z5111 Encounter for antineoplastic chemotherapy: Secondary | ICD-10-CM | POA: Insufficient documentation

## 2020-02-07 DIAGNOSIS — D509 Iron deficiency anemia, unspecified: Secondary | ICD-10-CM

## 2020-02-07 DIAGNOSIS — C187 Malignant neoplasm of sigmoid colon: Secondary | ICD-10-CM

## 2020-02-07 NOTE — Patient Instructions (Signed)
Dexamethasone injection Qu es este medicamento? La DEXAMETASONA es un corticosteroide. Se utiliza para tratar la inflamacin de la piel, articulaciones, pulmones y otros rganos. Condiciones comunes tratadas incluyen asma, alergias y artritis. Tambin se [place] para [employer], tales como trastornos sanguneos o enfermedades de la glndula suprarrenal. Este medicamento puede ser utilizado para otros usos; si tiene alguna pregunta consulte con su proveedor de atencin mdica o con su farmacutico. MARCAS COMUNES: Decadron, DoubleDex, Simplist Dexamethasone, Solurex Qu le debo informar a mi profesional de la salud antes de tomar este medicamento? Necesitan saber si usted presenta alguno de los siguientes problemas o situaciones: sndrome de [place] diabetes glaucoma enfermedad cardiaca presin sangunea alta infeccin, tal como herpes, sarampin, tuberculosis o varicela enfermedad renal enfermedad heptica trastorno mental miastenia grave osteoporosis ataque cardiaco previo convulsiones problemas estomacales o intestinales enfermedad tiroidea una reaccin alrgica o inusual a la dexametasona, a los corticosteroides, a otros medicamentos, a la lactosa, alimentos, colorantes o conservantes si est embarazada o buscando quedar embarazada si est amamantando a un beb Cmo debo [employer]? Este medicamento se administra mediante inyeccin por va intramuscular, en una articulacin, lesin, tejido blando o por va intravenosa. Lo administra un profesional de Technical sales engineer en un hospital o en un entorno clnico. Hable con su pediatra para informarse acerca del uso de este medicamento en nios. Puede requerir atencin especial. Sobredosis: Pngase en contacto inmediatamente con un centro toxicolgico o una sala de urgencia si usted cree que haya tomado demasiado medicamento. ATENCIN: [employer] es solo para usted. No comparta este medicamento con nadie. Qu sucede si me olvido de  una dosis? No se aplica en este caso. Si debe recibir [place] serie de inyecciones durante un perodo prolongado, trate de no olvidar. Si no puede asistir a una cita, llame al profesional que extiende sus recetas o a su profesional de la salud para pedir Theatre manager. Qu puede interactuar con este medicamento? No use este medicamento con ninguno de los siguientes frmacos: vacunas de virus vivos Este medicamento tambin puede interactuar con los siguientes frmacos: aminoglutetimida anfotericina B aspirina y medicamentos tipo aspirina ciertos antibiticos, tales como eritromicina, claritromicina y troleandomicina ciertos medicamentos antivirales para VIH o hepatitis ciertos medicamentos para convulsiones, tales como [place], fenobarbital y fenitona ciertos medicamentos para tratar la miastenia grave colestiramina ciclosporina digoxina diurticos efedrina hormonas femeninas, tales como estrgeno o progestina, y pldoras anticonceptivas insulina u otros medicamentos para la diabetes isoniazida ketoconazol medicamentos para Media planner los msculos antes de una ciruga mifepristona [place], medicamentos para Conservation officer, historic buildings y la inflamacin, tales como ibuprofeno o naproxeno rifampicina pruebas en la piel para las Set designer talidomida vacunas warfarina Puede ser que esta lista no menciona todas las posibles interacciones. Informe a su profesional de KB Home	[place] de [employer] productos a base de hierbas, medicamentos de [place] o suplementos nutritivos que est tomando. Si usted fuma, consume bebidas alcohlicas o si utiliza drogas ilegales, indqueselo tambin a su profesional de KB Home	[place]. Algunas sustancias pueden interactuar con su medicamento. A qu debo estar atento al usar [employer]? Visite a su profesional de la salud para que revise regularmente su evolucin. Informe a su profesional de la salud si sus sntomas no comienzan a mejorar o si empeoran. Se supervisar su estado de salud atentamente mientras reciba  este medicamento. Use un brazalete o una cadena de identificacin mdica. Lleve con usted una tarjeta que describa su enfermedad, detalles de su medicamento y horario de dosis. Este medicamento puede aumentar su riesgo de contraer una infeccin. Consulte  a su profesional de la salud si tiene fiebre, escalofros, dolor de garganta o cualquier otro sntoma de resfro o gripe. No se trate usted mismo. Trate de no acercarse a personas que estn enfermas. Llame a su profesional de la salud si est expuesto a cualquier persona con sarampin o varicela, o si desarrolla llagas o ampollas que no sanan correctamente. Si necesitar que le realicen una ciruga u otros procedimientos, informe a su mdico o profesional de la salud que ha tomado este [employer] ltimos 12 meses. Consulte a su mdico o a su profesional de la salud acerca de su dieta. Es posible que deba disminuir la cantidad de sal que come. Este medicamento puede aumentar los niveles de Dispensing optician. Pregntele a su profesional de la salud si es Chartered loss adjuster cambios en la dieta o en los medicamentos si usted tiene diabetes. Qu efectos secundarios puedo tener al [employer] este medicamento? Efectos secundarios que debe informar a su mdico o a Barrister's clerk de la salud tan pronto como sea posible: Chief of Staff, como erupcin cutnea, comezn/picazn o urticaria, e hinchazn de la cara, los labios o la lengua heces con sangre o de color negro y aspecto alquitranado cambios en las emociones o el estado de nimo cambios en la visin confusin, excitacin, inquietud estado de nimo deprimido dolor ocular alucinaciones debilidad muscular dolor de Paramedic o del rea abdominal grave o repentino signos y sntomas de niveles altos de azcar en la sangre, tales como tener ms sed o apetito, o tener que orinar con mayor frecuencia que lo habitual. Tambin puede sentirse muy cansado o tener visin borrosa. signos y sntomas de infeccin,  tales como fiebre, escalofros, tos, dolor de garganta, Social research officer, government o dificultad para orinar hinchazn de tobillos, pies sangrado o moretones inusuales heridas que no sanan Efectos secundarios que generalmente no requieren atencin mdica (infrmelos a su mdico o a su profesional de la salud si persisten o si son molestos): aumento del apetito aumento del crecimiento de vello en la cara o el cuerpo dolor de cabeza nuseas, vmito dolor, enrojecimiento o Actor de la inyeccin problemas de piel, acn, piel delgada y brillante dificultad para dormir aumento de peso Puede ser que esta lista no menciona todos los posibles efectos secundarios. Comunquese a su mdico por asesoramiento mdico [employer] Usted puede informar los efectos secundarios a la FDA por telfono al 1-800-FDA-1088. Dnde debo guardar mi medicina? Este medicamento se administra en hospitales o clnicas, y no necesitar guardarlo en su domicilio. ATENCIN: Este folleto es un resumen. Puede ser que no cubra toda la posible informacin. Si usted tiene preguntas acerca de esta medicina, consulte con su mdico, su farmacutico o su profesional de Technical sales engineer.  2020 Elsevier/Gold Standard (2019-07-17 00:00:00) Palonosetron Injection Qu es este medicamento? El PALONOSETRN se [place] para prevenir las nuseas y los vmitos causados por la quimioterapia. Tambin ayuda a [place] y los vmitos demorados que pueden ocurrir unos das despus de su tratamiento. Este medicamento puede ser utilizado para otros usos; si tiene alguna pregunta consulte con su proveedor de atencin mdica o con su farmacutico. MARCAS COMUNES: Aloxi Qu le debo informar a mi profesional de la salud antes de tomar este medicamento? Necesita saber si usted presenta alguno de los siguientes problemas o situaciones:  una reaccin alrgica o inusual al palonosetrn, dolasetrn, granisetrn, ondansetrn, a otros medicamentos,  alimentos, colorantes o conservantes  si est embarazada o buscando quedar embarazada  si est [employer]  a un beb Cmo debo utilizar este medicamento? Este medicamento se administra mediante infusin por va intravenosa. Lo administra un profesional de Technical sales engineer en un hospital o en un entorno clnico. Hable con su pediatra para informarse acerca del uso de este medicamento en nios. Aunque este medicamento se puede recetar a nios tan pequeos como de 1 mes de edad con ciertas afecciones, existen precauciones que deben tomarse. Sobredosis: Pngase en contacto inmediatamente con un centro toxicolgico o una sala de urgencia si usted cree que haya tomado demasiado medicamento. ATENCIN: [employer] es solo para usted. No comparta este medicamento con nadie. Qu sucede si me olvido de una dosis? No se aplica en este caso. Qu puede interactuar con este medicamento?  ciertos medicamentos para la depresin, ansiedad o trastornos psicticos  fentanilo  linezolid  IMAOs, tales como [place], [place], [place], [place] y [place]  azul de metileno (inyeccin por va intravenosa)  tramadol Puede ser que esta lista no menciona todas las posibles interacciones. Informe a su profesional de KB Home	[place] de [employer] productos a base de hierbas, medicamentos de [place] o suplementos nutritivos que est tomando. Si usted fuma, consume bebidas alcohlicas o si utiliza drogas ilegales, indqueselo tambin a su profesional de KB Home	[place]. Algunas sustancias pueden interactuar con su medicamento. A qu debo estar atento al usar Coca[employer]Cola? Se supervisar su estado de salud atentamente mientras reciba este medicamento. Qu efectos secundarios puedo tener al [employer] este medicamento? Efectos secundarios que debe informar a su mdico o a Barrister's clerk de la salud tan pronto como sea posible:  Chief of Staff como erupcin cutnea, picazn o urticarias, hinchazn de la cara, labios o  lengua  problemas respiratorios  confusin  mareos  pulso cardiaco rpido, irregular  fiebre y escalofros  prdida del equilibrio o coordinacin  convulsiones  sudoracin  hinchazn de las manos y pies  temblores  cansancio o debilidad inusual Efectos secundarios que, por lo general, no requieren Geophysical data processor (debe informarlos a su mdico o a Barrister's clerk de la salud si persisten o si son molestos):  estreimiento o diarrea  dolor de cabeza Puede ser que esta lista no menciona todos los posibles efectos secundarios. Comunquese a su mdico por asesoramiento mdico [employer]. Usted puede informar los efectos secundarios a la FDA por telfono al 1-800-FDA-1088. Dnde debo guardar mi medicina? Este medicamento se administra en hospitales o clnicas y no necesitar guardarlo en su domicilio. ATENCIN: Este folleto es un resumen. Puede ser que no cubra toda la posible informacin. Si usted tiene preguntas acerca de esta medicina, consulte con su mdico, su farmacutico o su profesional de Technical sales engineer.  2020 Elsevier/Gold Standard (2016-12-23 00:00:00) Oxaliplatin Injection Qu es este medicamento? El OXALIPLATINO es un agente quimioteraputico. Este medicamento acta sobre las clulas que se dividen rpidamente, como las clulas cancerosas, y finalmente provoca la muerte de estas clulas. Se utiliza en el tratamiento del cncer de colon y recto y 59 tipos de cncer. Este medicamento puede ser utilizado para otros usos; si tiene alguna pregunta consulte con su proveedor de atencin mdica o con su farmacutico. MARCAS COMUNES: Eloxatin Qu le debo informar a mi profesional de la salud antes de tomar este medicamento? Necesita saber si usted presenta alguno de los siguientes problemas o situaciones:  enfermedad renal  una reaccin alrgica o inusual al oxaliplatino, a otros agentes quimioteraputicos, a otros medicamentos, alimentos, colorantes o  conservantes  si est embarazada o buscando quedar embarazada  si est amamantando a un  beb Cmo debo utilizar este medicamento? Este medicamento se administra mediante infusin por va intravenosa. Lo administra un profesional de la salud calificado en un hospital o en un entorno clnico. Hable con su pediatra para informarse acerca del uso de este medicamento en nios. Puede requerir atencin especial. Sobredosis: Pngase en contacto inmediatamente con un centro toxicolgico o una sala de urgencia si usted cree que haya tomado demasiado medicamento. ATENCIN: [employer] es solo para usted. No comparta este medicamento con nadie. Qu sucede si me olvido de una dosis? Es importante no olvidar ninguna dosis. Informe a su mdico o a su profesional de la salud si no puede asistir a Photographer. Qu puede interactuar con este medicamento?  medicamentos para incrementar los conteos sanguneos, tales como filgrastim, pegfilgrastim, sargramostim  probenecid  ciertos antibiticos, tales como amicacina, gentamicina, neomicina, polimixina B, estreptomicina, tobramicina  zalcitabina Consulte a su mdico o a su profesional de la salud antes de tomar cualquiera de los siguientes medicamentos:  acetaminofeno  aspirina  ibuprofeno  quetoprofeno  naproxeno Puede ser que esta lista no menciona todas las posibles interacciones. Informe a su profesional de KB Home	[place] de [employer] productos a base de hierbas, medicamentos de [place] o suplementos nutritivos que est tomando. Si usted fuma, consume bebidas alcohlicas o si utiliza drogas ilegales, indqueselo tambin a su profesional de KB Home	[place]. Algunas sustancias pueden interactuar con su medicamento. A qu debo estar atento al usar Coca[employer]Cola? Se supervisar su condicin atentamente mientras reciba este medicamento. Tendr que hacerse anlisis de sangre peridicos mientras reciba este medicamento. Este medicamento puede aumentar su  sensibilidad al fro. No tomar bebidas fras o usar hielo. Cubra la piel expuesta antes de estar en contacto con temperaturas fras u objetos fros. Mientras se encuentra afuera cuando haga fro use ropa [place] su boca y su nariz para calentar el aire que entra en sus pulmones. Informe a su mdico si experimenta sensibilidad al fro. Este medicamento puede hacerle sentir un Nurse, mental health. Esto es normal ya que la quimioterapia afecta tanto a las clulas sanas como a las clulas cancerosas. Si presenta alguno de los [employer], infrmelos. Sin embargo, contine con el tratamiento aun si se siente enfermo, a menos que su mdico le indique que lo suspenda. En algunos casos, podr recibir [employer] para ayudarle con los efectos secundarios. Siga las instrucciones para usarlos. Consulte a su mdico o a su profesional de la salud por asesoramiento si tiene fiebre, escalofros, dolor de garganta o cualquier otro sntoma de resfro o gripe. No se trate usted mismo. Este medicamento puede reducir la capacidad del cuerpo para combatir infecciones. Trate de no acercarse a personas que estn enfermas. [employer] puede aumentar el riesgo de magulladuras o sangrado. Consulte a su mdico o a su profesional de la salud si observa sangrados inusuales. Proceda con cuidado al cepillar sus dientes, usar hilo dental o Risk manager palillos para los dientes, ya que puede contraer una infeccin o Therapist, art con mayor facilidad. Si se somete a algn tratamiento dental, informe a su dentista que est [employer]. Evite tomar productos que contienen aspirina, acetaminofeno, ibuprofeno, naproxeno o quetoprofeno a menos que as lo indique su mdico. Estos productos pueden disimular la fiebre. No se debe quedar embarazada mientras reciba este medicamento. Las mujeres deben informar a su mdico si estn buscando quedar embarazadas o si creen que estn embarazadas. Existe la posibilidad de  efectos secundarios graves a un beb sin nacer. Para ms informacin hable con  su profesional de la salud o su farmacutico. No debe Economist a un beb mientras reciba este medicamento. Si tiene diarrea, llame a su mdico o a su profesional de [employer] Home	[place]. No se trate usted mismo. Qu efectos secundarios puedo tener al [employer] este medicamento? Efectos secundarios que debe informar a su mdico o a Barrister's clerk de la salud tan pronto como sea posible:  Chief of Staff como erupcin cutnea, picazn o urticarias, hinchazn de la cara, labios o lengua  conteos sanguneos bajos - este medicamento puede reducir la cantidad de glbulos blancos, glbulos rojos y plaquetas. Su riesgo de infeccin y [place].  signos de infeccin - fiebre o escalofros, tos, dolor de garganta, Social research officer, government o dificultad para orinar  signos de reduccin de plaquetas o sangrado - magulladuras, puntos rojos en la piel, heces de color oscuro o con aspecto alquitranado, sangrado por la nariz  signos de reduccin de glbulos rojos - cansancio o debilidad inusual, desmayos, sensacin de Lobbyist u opresin en el pecho  tos  diarrea  tensin en la mandbula  llagas en la boca  nuseas, vmito  dolor, hinchazn, enrojecimiento o irritacin en el lugar de la inyeccin  dolor, hormigueo, entumecimiento de manos o pies  problemas de coordinacin, del habla, al caminar  enrojecimiento, formacin de ampollas, descamacin o distensin de la piel, inclusive dentro de la boca  dificultad para orinar o cambios en el volumen de orina Efectos secundarios que, por lo general, no requieren atencin mdica (debe informarlos a su mdico o a su profesional de la salud si persisten o si son molestos):  cambios en la visin  estreimiento  cada del cabello  prdida del apetito  sabor metlico en la boca o cambios en el sentido del gusto  dolor estomacal Puede ser que esta  lista no menciona todos los posibles efectos secundarios. Comunquese a su mdico por asesoramiento mdico [employer]. Usted puede informar los efectos secundarios a la FDA por telfono al 1-800-FDA-1088. Dnde debo guardar mi medicina? Este medicamento se administra en hospitales o clnicas y no necesitar guardarlo en su domicilio. ATENCIN: Este folleto es un resumen. Puede ser que no cubra toda la posible informacin. Si usted tiene preguntas acerca de esta medicina, consulte con su mdico, su farmacutico o su profesional de Technical sales engineer.  2020 Elsevier/Gold Standard (2015-01-14 00:00:00)

## 2020-02-07 NOTE — Progress Notes (Signed)
Patient is here for his first treatment. He is with his daughter who will help translate for him. He says he is ready to start. He has his capecitabine at home and has taken the first dose. Him and his daughter, Theda [place], know to call the office or myself with any questions or concerns.

## 2020-02-14 DIAGNOSIS — C187 Malignant neoplasm of sigmoid colon: Secondary | ICD-10-CM

## 2020-02-14 DIAGNOSIS — Z23 Encounter for immunization: Secondary | ICD-10-CM

## 2020-02-14 DIAGNOSIS — E1165 Type 2 diabetes mellitus with hyperglycemia: Secondary | ICD-10-CM

## 2020-02-14 NOTE — Patient Instructions (Signed)
Preventing Cerebrovascular Disease  Arteries are blood vessels that carry blood that contains oxygen from the heart to all parts of the body. Cerebrovascular disease affects arteries that supply the brain. Any condition that blocks or disrupts blood flow to the brain can cause cerebrovascular disease. Brain cells that lose blood supply start to die within minutes (stroke). Stroke is the main danger of cerebrovascular disease. Atherosclerosis and high blood pressure are common causes of cerebrovascular disease. Atherosclerosis is narrowing and hardening of an artery that results when fat, cholesterol, calcium, or other substances (plaque) build up inside an artery. Plaque reduces blood flow through the artery. High blood pressure increases the risk of bleeding inside the brain. Making diet and lifestyle changes to prevent atherosclerosis and high blood pressure lowers your risk of cerebrovascular disease. What nutrition changes can be made?  Eat more fruits, vegetables, and whole grains.  Reduce how much saturated fat you eat. To do this, eat less red meat and fewer full-fat dairy products.  Eat healthy proteins instead of red meat. Healthy proteins include: ? Fish. Eat fish that contains heart-healthy omega-3 fatty acids, twice a week. Examples include salmon, albacore tuna, mackerel, and herring. ? Chicken. ? Nuts. ? Low-fat or nonfat yogurt.  Avoid processed meats, like bacon and lunchmeat.  Avoid foods that contain: ? A lot of sugar, such as sweets and drinks with added sugar. ? A lot of salt (sodium). Avoid adding extra salt to your food, as told by your health care provider. ? Trans fats, such as margarine and baked goods. Trans fats may be listed as "partially hydrogenated oils" on food labels.  Check food labels to see how much sodium, sugar, and trans fats are in foods.  Use vegetable oils that contain low amounts of saturated fat, such as olive oil or canola oil. What lifestyle  changes can be made?  Drink alcohol in moderation. This means no more than 1 drink a day for nonpregnant women and 2 drinks a day for men. One drink equals 12 oz of beer, 5 oz of wine, or 1 oz of hard liquor.  If you are overweight, ask your health care provider to recommend a weight-loss plan for you. Losing 5-10 lb (2.2-4.5 kg) can reduce your risk of diabetes, atherosclerosis, and high blood pressure.  Exercise for 30?60 minutes on most days, or as much as told by your health care provider. ? Do moderate-intensity exercise, such as brisk walking, bicycling, and water aerobics. Ask your health care provider which activities are safe for you.   Do not use any products that contain nicotine or tobacco, such as cigarettes and e-cigarettes. If you need help quitting, ask your health care provider. Why are these changes important? Making these changes lowers your risk of many diseases that can cause cerebrovascular disease and stroke. Stroke is a leading cause of death and disability. Making these changes also improves your overall health and quality of life. What can I do to lower my risk? The following factors make you more likely to develop cerebrovascular disease:  Being overweight.  Smoking.  Being physically inactive.  Eating a high-fat diet.  Having certain health conditions, such as: ? Diabetes. ? High blood pressure. ? Heart disease. ? Atherosclerosis. ? High cholesterol. ? Sickle cell disease.  Talk with your health care provider about your risk for cerebrovascular disease. Work with your health care provider to control diseases that you have that may contribute to cerebrovascular disease. Your health care provider may prescribe medicines to  help prevent major causes of cerebrovascular disease. Where to find more information Learn more about preventing cerebrovascular disease from:  [place]ana Diaz, Lung, and [place]:  [employer].de  Centers for Disease Control and Prevention: http://www.curry-wood.biz/ Summary  Cerebrovascular disease can lead to a stroke.  Atherosclerosis and high blood pressure are major causes of cerebrovascular disease.  Making diet and lifestyle changes can reduce your risk of cerebrovascular disease.  Work with your health care provider to get your risk factors under control to reduce your risk of cerebrovascular disease. This information is not intended to replace advice given to you by your health care provider. Make sure you discuss any questions you have with your health care provider. Document Revised: 11/04/2017 Document Reviewed: 12/07/2015 Elsevier Patient Education  [place]. Diabetes Mellitus and Nutrition, Adult When you have diabetes (diabetes mellitus), it is very important to have healthy eating habits because your blood sugar (glucose) levels are greatly affected by what you eat and drink. Eating healthy foods in the appropriate amounts, at about the same times every day, can help you:  Control your blood glucose.  Lower your risk of heart disease.  Improve your blood pressure.  Reach or maintain a healthy weight. Every person with diabetes is different, and each person has different needs for a meal plan. Your health care provider may recommend that you work with a diet and nutrition specialist (dietitian) to make a meal plan that is best for you. Your meal plan may vary depending on factors such as:  The calories you need.  The medicines you take.  Your weight.  Your blood glucose, blood pressure, and cholesterol levels.  Your activity level.  Other health conditions you have, such as heart or kidney disease. How do carbohydrates affect me? Carbohydrates, also called carbs, affect your blood glucose level more than any other type of food. Eating carbs naturally raises the amount of glucose in your blood.  Carb counting is a method for keeping track of how many carbs you eat. Counting carbs is important to keep your blood glucose at a healthy level, especially if you use insulin or take certain oral diabetes medicines. It is important to know how many carbs you can safely have in each meal. This is different for every person. Your dietitian can help you calculate how many carbs you should have at each meal and for each snack. Foods that contain carbs include:  Bread, cereal, rice, pasta, and crackers.  Potatoes and corn.  Peas, beans, and lentils.  Milk and yogurt.  Fruit and juice.  Desserts, such as cakes, cookies, ice cream, and candy. How does alcohol affect me? Alcohol can cause a sudden decrease in blood glucose (hypoglycemia), especially if you use insulin or take certain oral diabetes medicines. Hypoglycemia can be a life-threatening condition. Symptoms of hypoglycemia (sleepiness, dizziness, and confusion) are similar to symptoms of having too much alcohol. If your health care provider says that alcohol is safe for you, follow these guidelines:  Limit alcohol intake to no more than 1 drink per day for nonpregnant women and 2 drinks per day for men. One drink equals 12 oz of beer, 5 oz of wine, or 1 oz of hard liquor.  Do not drink on an empty stomach.  Keep yourself hydrated with water, diet soda, or unsweetened iced tea.  Keep in mind that regular soda, juice, and other mixers may contain a lot of sugar and must be counted as carbs. What are tips for following this  plan?  Reading food labels  Start by checking the serving size on the "Nutrition Facts" label of packaged foods and drinks. The amount of calories, carbs, fats, and other nutrients listed on the label is based on one serving of the item. Many items contain more than one serving per package.  Check the total grams (g) of carbs in one serving. You can calculate the number of servings of carbs in one serving by  dividing the total carbs by 15. For example, if a food has 30 g of total carbs, it would be equal to 2 servings of carbs.  Check the number of grams (g) of saturated and trans fats in one serving. Choose foods that have low or no amount of these fats.  Check the number of milligrams (mg) of salt (sodium) in one serving. Most people should limit total sodium intake to less than 2,300 mg per day.  Always check the nutrition information of foods labeled as "low-fat" or "nonfat". These foods may be higher in added sugar or refined carbs and should be avoided.  Talk to your dietitian to identify your daily goals for nutrients listed on the label. Shopping  Avoid buying canned, premade, or processed foods. These foods tend to be high in fat, sodium, and added sugar.  Shop around the outside edge of the grocery store. This includes fresh fruits and vegetables, bulk grains, fresh meats, and fresh dairy. Cooking  Use low-heat cooking methods, such as baking, instead of high-heat cooking methods like deep frying.  Cook using healthy oils, such as olive, canola, or sunflower oil.  Avoid cooking with butter, cream, or high-fat meats. Meal planning  Eat meals and snacks regularly, preferably at the same times every day. Avoid going long periods of time without eating.  Eat foods high in fiber, such as fresh fruits, vegetables, beans, and whole grains. Talk to your dietitian about how many servings of carbs you can eat at each meal.  Eat 4-6 ounces (oz) of lean protein each day, such as lean meat, chicken, fish, eggs, or tofu. One oz of lean protein is equal to: ? 1 oz of meat, chicken, or fish. ? 1 egg. ?  cup of tofu.  Eat some foods each day that contain healthy fats, such as avocado, nuts, seeds, and fish. Lifestyle  Check your blood glucose regularly.  Exercise regularly as told by your health care provider. This may include: ? 150 minutes of moderate-intensity or vigorous-intensity  exercise each week. This could be brisk walking, biking, or water aerobics. ? Stretching and doing strength exercises, such as yoga or weightlifting, at least 2 times a week.  Take medicines as told by your health care provider.  Do not use any products that contain nicotine or tobacco, such as cigarettes and e-cigarettes. If you need help quitting, ask your health care provider.  Work with a Social worker or diabetes educator to identify strategies to manage stress and any emotional and social challenges. Questions to ask a health care provider  Do I need to meet with a diabetes educator?  Do I need to meet with a dietitian?  What number can I call if I have questions?  When are the best times to check my blood glucose? Where to find more information:  American Diabetes Association: diabetes.org  Academy of Nutrition and Dietetics: www.eatright.[employer] of Diabetes and Digestive and Kidney Diseases (NIH): [employer] Summary  A healthy meal plan will help you control your blood glucose and maintain  a healthy lifestyle.  Working with a diet and nutrition specialist (dietitian) can help you make a meal plan that is best for you.  Keep in mind that carbohydrates (carbs) and alcohol have immediate effects on your blood glucose levels. It is important to count carbs and to use alcohol carefully. This information is not intended to replace advice given to you by your health care provider. Make sure you discuss any questions you have with your health care provider. Document Revised: 11/04/2017 Document Reviewed: 12/27/2016 Elsevier Patient Education  2020 [employer].

## 2020-02-28 DIAGNOSIS — C187 Malignant neoplasm of sigmoid colon: Secondary | ICD-10-CM

## 2020-02-28 DIAGNOSIS — R140 Abdominal distension (gaseous): Secondary | ICD-10-CM

## 2020-02-28 DIAGNOSIS — R739 Hyperglycemia, unspecified: Secondary | ICD-10-CM

## 2020-02-28 DIAGNOSIS — E871 Hypo-osmolality and hyponatremia: Secondary | ICD-10-CM

## 2020-02-28 NOTE — Patient Instructions (Signed)
[facility] Discharge Instructions for Patients Receiving Chemotherapy  Today you received the following chemotherapy agents Oxaliplatin   To help prevent nausea and vomiting after your treatment, we encourage you to take your nausea medication    If you develop nausea and vomiting that is not controlled by your nausea medication, call the clinic.   BELOW ARE SYMPTOMS THAT SHOULD BE REPORTED IMMEDIATELY:  *FEVER GREATER THAN 100.5 F  *CHILLS WITH OR WITHOUT FEVER  NAUSEA AND VOMITING THAT IS NOT CONTROLLED WITH YOUR NAUSEA MEDICATION  *UNUSUAL SHORTNESS OF BREATH  *UNUSUAL BRUISING OR BLEEDING  TENDERNESS IN MOUTH AND THROAT WITH OR WITHOUT PRESENCE OF ULCERS  *URINARY PROBLEMS  *BOWEL PROBLEMS  UNUSUAL RASH Items with * indicate a potential emergency and should be followed up as soon as possible.  Feel free to call the clinic should you have any questions or concerns. The clinic phone number is [phone].  Please show the CHEMO ALERT CARD at check-in to the Emergency Department and triage nurse.   

## 2020-02-28 NOTE — Progress Notes (Signed)
Reviewed labs with Dr. Maylon Peppers.  Dr Maylon Peppers wants patient to get 1 L of fluid.  Aware of Glucose level and other labs .  Ok to treat today.

## 2020-02-28 NOTE — Patient Instructions (Signed)
Implanted Port Insertion, Care After °This sheet gives you information about how to care for yourself after your procedure. Your health care provider may also give you more specific instructions. If you have problems or questions, contact your health care provider. °What can I expect after the procedure? °After the procedure, it is common to have: °· Discomfort at the port insertion site. °· Bruising on the skin over the port. This should improve over 3-4 days. °Follow these instructions at home: °Port care °· After your port is placed, you will get a manufacturer's information card. The card has information about your port. Keep this card with you at all times. °· Take care of the port as told by your health care provider. Ask your health care provider if you or a family member can get training for taking care of the port at home. A home health care nurse may also take care of the port. °· Make sure to remember what type of port you have. °Incision care ° °  ° °· Follow instructions from your health care provider about how to take care of your port insertion site. Make sure you: °? Wash your hands with soap and water before and after you change your bandage (dressing). If soap and water are not available, use hand sanitizer. °? Change your dressing as told by your health care provider. °? Leave stitches (sutures), skin glue, or adhesive strips in place. These skin closures may need to stay in place for 2 weeks or longer. If adhesive strip edges start to loosen and curl up, you may trim the loose edges. Do not remove adhesive strips completely unless your health care provider tells you to do that. °· Check your port insertion site every day for signs of infection. Check for: °? Redness, swelling, or pain. °? Fluid or blood. °? Warmth. °? Pus or a bad smell. °Activity °· Return to your normal activities as told by your health care provider. Ask your health care provider what activities are safe for you. °· Do not  lift anything that is heavier than 10 lb (4.5 kg), or the limit that you are told, until your health care provider says that it is safe. °General instructions °· Take over-the-counter and prescription medicines only as told by your health care provider. °· Do not take baths, swim, or use a hot tub until your health care provider approves. Ask your health care provider if you may take showers. You may only be allowed to take sponge baths. °· Do not drive for 24 hours if you were given a sedative during your procedure. °· Wear a medical alert bracelet in case of an emergency. This will tell any health care providers that you have a port. °· Keep all follow-up visits as told by your health care provider. This is important. °Contact a health care provider if: °· You cannot flush your port with saline as directed, or you cannot draw blood from the port. °· You have a fever or chills. °· You have redness, swelling, or pain around your port insertion site. °· You have fluid or blood coming from your port insertion site. °· Your port insertion site feels warm to the touch. °· You have pus or a bad smell coming from the port insertion site. °Get help right away if: °· You have chest pain or shortness of breath. °· You have bleeding from your port that you cannot control. °Summary °· Take care of the port as told by your health   care provider. Keep the manufacturer's information card with you at all times. °· Change your dressing as told by your health care provider. °· Contact a health care provider if you have a fever or chills or if you have redness, swelling, or pain around your port insertion site. °· Keep all follow-up visits as told by your health care provider. °This information is not intended to replace advice given to you by your health care provider. Make sure you discuss any questions you have with your health care provider. °Document Revised: 06/20/2018 Document Reviewed: 06/20/2018 °Elsevier Patient Education ©  2020 Elsevier Inc. ° °

## 2020-02-28 NOTE — Progress Notes (Signed)
Patient is here with son for second treatment. Other than increased blood sugar levels he has tolerated his first cycle well. He is ready to proceed with cycle two today.

## 2020-03-13 DIAGNOSIS — C187 Malignant neoplasm of sigmoid colon: Secondary | ICD-10-CM

## 2020-03-13 DIAGNOSIS — E1165 Type 2 diabetes mellitus with hyperglycemia: Secondary | ICD-10-CM

## 2020-03-13 NOTE — Progress Notes (Signed)
Pharmacist Chemotherapy Monitoring - Follow Up Assessment    I verify that I have reviewed each item in the below checklist:  . Regimen for the patient is scheduled for the appropriate day and plan matches scheduled date. Marland Kitchen Appropriate non-routine labs are ordered dependent on drug ordered. . If applicable, additional medications reviewed and ordered per protocol based on lifetime cumulative doses and/or treatment regimen.   Plan for follow-up and/or issues identified: No . I-vent associated with next due treatment: No . MD and/or nursing notified: No  Dakota Washington, Dakota Washington 03/13/2020 8:21 AM
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
## 2020-03-20 DIAGNOSIS — C187 Malignant neoplasm of sigmoid colon: Secondary | ICD-10-CM

## 2020-03-20 DIAGNOSIS — E222 Syndrome of inappropriate secretion of antidiuretic hormone: Secondary | ICD-10-CM

## 2020-03-20 DIAGNOSIS — Z5111 Encounter for antineoplastic chemotherapy: Secondary | ICD-10-CM | POA: Insufficient documentation

## 2020-03-20 DIAGNOSIS — E871 Hypo-osmolality and hyponatremia: Secondary | ICD-10-CM

## 2020-03-20 DIAGNOSIS — R1084 Generalized abdominal pain: Secondary | ICD-10-CM

## 2020-03-20 NOTE — Patient Instructions (Signed)
Tunneled Central Venous Catheter Flushing Guide  It is important to flush your tunneled central venous catheter each time you use it, both before and after you use it. Flushing your catheter will help prevent it from clogging. What are the risks? Risks may include:  Infection.  Air getting into the catheter and bloodstream. Supplies needed:  A clean pair of gloves.  A disinfecting wipe. Use an alcohol wipe, chlorhexidine wipe, or iodine wipe as told by your health care provider.  A 10 mL syringe that has been prefilled with saline solution.  An empty 10 mL syringe, if a substance called heparin was injected into your catheter. How to flush your catheter When you flush your catheter, make sure you follow any specific instructions from your health care provider or the manufacturer. These are general guidelines. Flushing your catheter before use If there is heparin in your catheter: 1. Wash your hands with soap and water. 2. Put on gloves. 3. Scrub the injection cap for a minimum of 15 seconds with a disinfecting wipe. 4. Unclamp the catheter. 5. Attach the empty syringe to the injection cap. 6. Pull the syringe plunger back and withdraw 10 mL of blood. 7. Place the syringe into an appropriate waste container. 8. Scrub the injection cap for 15 seconds with a disinfecting wipe. 9. Attach the prefilled syringe to the injection cap. 10. Flush the catheter by pushing the plunger forward until all the liquid from the syringe is in the catheter. 11. Remove the syringe from the injection cap. 12. Clamp the catheter. If there is no heparin in your catheter: 1. Wash your hands with soap and water. 2. Put on gloves. 3. Scrub the injection cap for 15 seconds with a disinfecting wipe. 4. Unclamp the catheter. 5. Attach the prefilled syringe to the injection cap. 6. Flush the catheter by pushing the plunger forward until 5 mL of the liquid from the syringe is in the catheter. 7. Pull back on  the syringe until you see blood in the catheter. 8. If you have been asked to collect any blood, follow your health care provider's instructions. Otherwise, flush the catheter with the rest of the solution from the syringe. 9. Remove the syringe from the injection cap. 10. Clamp the catheter.  Flushing your catheter after use 1. Wash your hands with soap and water. 2. Put on gloves. 3. Scrub the injection cap for 15 seconds with a disinfecting wipe. 4. Unclamp the catheter. 5. Attach the prefilled syringe to the injection cap. 6. Flush the catheter by pushing the plunger forward until all of the liquid from the syringe is in the catheter. 7. Remove the syringe from the injection cap. 8. Clamp the catheter. Problems and solutions  If blood cannot be completely cleared from the injection cap, you may need to have the injection cap replaced.  If the catheter is difficult to flush, use the pulsing method. The pulsing method involves pushing only a few milliliters of solution into the catheter at a time and pausing between pushes.  If you do not see blood in the catheter when you pull back on the syringe, change your body position, such as by raising your arms above your head. Take a deep breath and cough. Then, pull back on the syringe. If you still do not see blood, flush the catheter with a small amount of solution. Then, change positions again and take a breath or cough. Pull back on the syringe again. If you still do not see   blood, finish flushing the catheter and contact your health care provider. Do not use your catheter until your health care provider says it is okay. General tips  Have someone help you flush your catheter, if possible.  Do not force fluid through your catheter.  Do not use a syringe that is larger or smaller than 10 mL. Using a smaller syringe can make the catheter burst.  Do not use your catheter without flushing it first if it has heparin in it. Contact a health  care provider if:  You cannot see any blood in the catheter when you flush it before using it.  Your catheter is difficult to flush. Get help right away if:  You cannot flush the catheter.  The catheter leaks when you flush it or when there is fluid in it.  There are cracks or breaks in the catheter. Summary  It is important to flush your tunneled central venous catheter each time you use it, both before and after you use it.  Scrub the injection cap for 15 seconds with a disinfecting wipe before and after you flush it.  When you flush your catheter, make sure you follow any specific instructions from your health care provider or the manufacturer.  Get help right away if you cannot flush the catheter. This information is not intended to replace advice given to you by your health care provider. Make sure you discuss any questions you have with your health care provider. Document Revised: 08/17/2019 Document Reviewed: 02/07/2019 Elsevier Patient Education  2020 Elsevier Inc.  

## 2020-03-20 NOTE — Progress Notes (Signed)
Pt. to get 1 L of normal saline today per Dr. Maylon Peppers vverbal order. OK to infuse it at 750 ml/hr  Per Dr. Maylon Peppers.

## 2020-03-20 NOTE — Telephone Encounter (Signed)
No change in appts  Per 4/15 los

## 2020-04-03 NOTE — Progress Notes (Signed)
Pharmacist Chemotherapy Monitoring - Follow Up Assessment    I verify that I have reviewed each item in the below checklist:  . Regimen for the patient is scheduled for the appropriate day and plan matches scheduled date. Marland Kitchen Appropriate non-routine labs are ordered dependent on drug ordered. . If applicable, additional medications reviewed and ordered per protocol based on lifetime cumulative doses and/or treatment regimen.   Plan for follow-up and/or issues identified: No . I-vent associated with next due treatment: No . MD and/or nursing notified: No  Xeng Kucher, Jacqlyn Larsen 04/03/2020 9:19 AM

## 2020-04-10 DIAGNOSIS — C187 Malignant neoplasm of sigmoid colon: Secondary | ICD-10-CM

## 2020-04-10 DIAGNOSIS — E871 Hypo-osmolality and hyponatremia: Secondary | ICD-10-CM

## 2020-04-10 DIAGNOSIS — Z5111 Encounter for antineoplastic chemotherapy: Secondary | ICD-10-CM | POA: Insufficient documentation

## 2020-04-10 DIAGNOSIS — E1165 Type 2 diabetes mellitus with hyperglycemia: Secondary | ICD-10-CM

## 2020-04-10 NOTE — Progress Notes (Signed)
Dr. Maylon Peppers notified of [place].  No new orders received at this time.

## 2020-04-10 NOTE — Patient Instructions (Signed)
Oxaliplatin Injection Qu es este medicamento? El OXALIPLATINO es un agente quimioteraputico. Este medicamento acta sobre las clulas que se dividen rpidamente, como las clulas cancerosas, y finalmente provoca la muerte de estas clulas. Se utiliza en el tratamiento del cncer de colon y recto y otros tipos de cncer. Este medicamento puede ser utilizado para otros usos; si tiene alguna pregunta consulte con su proveedor de atencin mdica o con su farmacutico. MARCAS COMUNES: Eloxatin Qu le debo informar a mi profesional de la salud antes de tomar este medicamento? Necesita saber si usted presenta alguno de los siguientes problemas o situaciones:  enfermedad renal  una reaccin alrgica o inusual al oxaliplatino, a otros agentes quimioteraputicos, a otros medicamentos, alimentos, colorantes o conservantes  si est embarazada o buscando quedar embarazada  si est amamantando a un beb Cmo debo utilizar este medicamento? Este medicamento se administra mediante infusin por va intravenosa. Lo administra un profesional de la salud calificado en un hospital o en un entorno clnico. Hable con su pediatra para informarse acerca del uso de este medicamento en nios. Puede requerir atencin especial. Sobredosis: Pngase en contacto inmediatamente con un centro toxicolgico o una sala de urgencia si usted cree que haya tomado demasiado medicamento. ATENCIN: Este medicamento es solo para usted. No comparta este medicamento con nadie. Qu sucede si me olvido de una dosis? Es importante no olvidar ninguna dosis. Informe a su mdico o a su profesional de la salud si no puede asistir a una cita. Qu puede interactuar con este medicamento?  medicamentos para incrementar los conteos sanguneos, tales como filgrastim, pegfilgrastim, sargramostim  probenecid  ciertos antibiticos, tales como amicacina, gentamicina, neomicina, polimixina B, estreptomicina, tobramicina  zalcitabina Consulte  a su mdico o a su profesional de la salud antes de tomar cualquiera de los siguientes medicamentos:  acetaminofeno  aspirina  ibuprofeno  quetoprofeno  naproxeno Puede ser que esta lista no menciona todas las posibles interacciones. Informe a su profesional de la salud de todos los productos a base de hierbas, medicamentos de venta libre o suplementos nutritivos que est tomando. Si usted fuma, consume bebidas alcohlicas o si utiliza drogas ilegales, indqueselo tambin a su profesional de la salud. Algunas sustancias pueden interactuar con su medicamento. A qu debo estar atento al usar este medicamento? Se supervisar su condicin atentamente mientras reciba este medicamento. Tendr que hacerse anlisis de sangre peridicos mientras reciba este medicamento. Este medicamento puede aumentar su sensibilidad al fro. No tomar bebidas fras o usar hielo. Cubra la piel expuesta antes de estar en contacto con temperaturas fras u objetos fros. Mientras se encuentra afuera cuando haga fro use ropa abrigada y cubra su boca y su nariz para calentar el aire que entra en sus pulmones. Informe a su mdico si experimenta sensibilidad al fro. Este medicamento puede hacerle sentir un malestar general. Esto es normal ya que la quimioterapia afecta tanto a las clulas sanas como a las clulas cancerosas. Si presenta alguno de los efectos secundarios, infrmelos. Sin embargo, contine con el tratamiento aun si se siente enfermo, a menos que su mdico le indique que lo suspenda. En algunos casos, podr recibir medicamentos adicionales para ayudarle con los efectos secundarios. Siga las instrucciones para usarlos. Consulte a su mdico o a su profesional de la salud por asesoramiento si tiene fiebre, escalofros, dolor de garganta o cualquier otro sntoma de resfro o gripe. No se trate usted mismo. Este medicamento puede reducir la capacidad del cuerpo para combatir infecciones. Trate de no acercarse a personas  que   estn enfermas. [employer] puede aumentar el riesgo de magulladuras o sangrado. Consulte a su mdico o a su profesional de la salud si observa sangrados inusuales. Proceda con cuidado al cepillar sus dientes, usar hilo dental o Risk manager palillos para los dientes, ya que puede contraer una infeccin o Therapist, art con mayor facilidad. Si se somete a algn tratamiento dental, informe a su dentista que est [employer]. Evite tomar productos que contienen aspirina, acetaminofeno, ibuprofeno, naproxeno o quetoprofeno a menos que as lo indique su mdico. Estos productos pueden disimular la fiebre. No se debe quedar embarazada mientras reciba este medicamento. Las mujeres deben informar a su mdico si estn buscando quedar embarazadas o si creen que estn embarazadas. Existe la posibilidad de efectos secundarios graves a un beb sin nacer. Para ms informacin hable con su profesional de la salud o su farmacutico. No debe [employer]ist a un beb mientras reciba este medicamento. Si tiene diarrea, llame a su mdico o a su profesional de [employer]	[place]. No se trate usted mismo. Qu efectos secundarios puedo tener al [employer] este medicamento? Efectos secundarios que debe informar a su mdico o a Barrister's clerk de la salud tan pronto como sea posible:  Chief of Staff como erupcin cutnea, picazn o urticarias, hinchazn de la cara, labios o lengua  conteos sanguneos bajos - este medicamento puede reducir la cantidad de glbulos blancos, glbulos rojos y plaquetas. Su riesgo de infeccin y Alexis.  signos de infeccin - fiebre o escalofros, tos, dolor de garganta, Social research officer, government o dificultad para orinar  signos de reduccin de plaquetas o sangrado - magulladuras, puntos rojos en la piel, heces de color oscuro o con aspecto alquitranado, sangrado por la nariz  signos de reduccin de glbulos rojos - cansancio o debilidad inusual, desmayos, sensacin de Technical sales engineer u opresin en el pecho  tos  diarrea  tensin en la mandbula  llagas en la boca  nuseas, vmito  dolor, hinchazn, enrojecimiento o irritacin en el lugar de la inyeccin  dolor, hormigueo, entumecimiento de manos o pies  problemas de coordinacin, del habla, al caminar  enrojecimiento, formacin de ampollas, descamacin o distensin de la piel, inclusive dentro de la boca  dificultad para orinar o cambios en el volumen de orina Efectos secundarios que, por lo general, no requieren atencin mdica (debe informarlos a su mdico o a su profesional de la salud si persisten o si son molestos):  cambios en la visin  estreimiento  cada del cabello  prdida del apetito  sabor metlico en la boca o cambios en el sentido del gusto  dolor estomacal Puede ser que esta lista no menciona todos los posibles efectos secundarios. Comunquese a su mdico por asesoramiento mdico [employer] Usted puede informar los efectos secundarios a la FDA por telfono al 1-800-FDA-1088. Dnde debo guardar mi medicina? Este medicamento se administra en hospitales o clnicas y no necesitar guardarlo en su domicilio. ATENCIN: Este folleto es un resumen. Puede ser que no cubra toda la posible informacin. Si usted tiene preguntas acerca de esta medicina, consulte con su mdico, su farmacutico o su profesional de Technical sales engineer.  2020 Elsevier/Gold Standard (2015-01-14 00:00:00)

## 2020-04-10 NOTE — Telephone Encounter (Signed)
Appointments scheduled calendar printed per 5/6 los 

## 2020-04-18 NOTE — Progress Notes (Signed)
Can you call and see how his blood glucose and then have him make an earlier apt if they are not in range. Thanks note came in from Oncology thanks

## 2020-05-01 NOTE — Progress Notes (Signed)
Called patient's daughter as he was a no-show to todays' appointment. The family had mixed up days and forgot that today was his appointment. Transferred to scheduling to reschedule his appointment. Reschedule to 05/09/2020

## 2020-05-08 NOTE — Progress Notes (Signed)
Called and lvm for patient to return our call.

## 2020-05-09 DIAGNOSIS — Z794 Long term (current) use of insulin: Secondary | ICD-10-CM | POA: Insufficient documentation

## 2020-05-09 DIAGNOSIS — D508 Other iron deficiency anemias: Secondary | ICD-10-CM

## 2020-05-09 DIAGNOSIS — C187 Malignant neoplasm of sigmoid colon: Secondary | ICD-10-CM

## 2020-05-09 DIAGNOSIS — Z5111 Encounter for antineoplastic chemotherapy: Secondary | ICD-10-CM | POA: Insufficient documentation

## 2020-05-09 DIAGNOSIS — Z9049 Acquired absence of other specified parts of digestive tract: Secondary | ICD-10-CM | POA: Insufficient documentation

## 2020-05-09 DIAGNOSIS — Z79899 Other long term (current) drug therapy: Secondary | ICD-10-CM | POA: Insufficient documentation

## 2020-05-09 DIAGNOSIS — E119 Type 2 diabetes mellitus without complications: Secondary | ICD-10-CM | POA: Insufficient documentation

## 2020-05-09 DIAGNOSIS — D509 Iron deficiency anemia, unspecified: Secondary | ICD-10-CM | POA: Insufficient documentation

## 2020-05-09 DIAGNOSIS — R197 Diarrhea, unspecified: Secondary | ICD-10-CM | POA: Insufficient documentation

## 2020-05-09 NOTE — Patient Instructions (Signed)

## 2020-05-09 NOTE — Progress Notes (Signed)
Hematology and Oncology Follow Up Visit  Dakota Washington [id number] 01/06/1956 64 y.o. 05/09/2020   Principle Diagnosis:  Stage IIIB (pT4a,pN1c,M0) adenocarcinoma of the sigmoid colon, MSI stable  Iron deficiency anemia  Past Therapy: Lap sigmoid colectomy with re-anastomosis - 12/31/2019  Current Therapy: Adjuvant CAPOX x 8 cycles (tentatively), s/p cycle 4 IV iron as indicated    Interim History:  Dakota Washington is here today with his son for follow-up and treatment. He has had diarrhea for several days after each treatment.  His blood glucose is also been running quite high. He has numbness and tingling in his fingertips with this.  No swelling or tenderness in his extremities.  No falls or syncopal episodes to report.  No fever, chills, n/v, cough, rash, dizziness, SOB, chest pain, palpitations, abdominal pain or changes in bowel or bladder habits.  No bleeding, bruising or petechiae.  He states that he is eating well and staying hydrated. His weight is stable.   ECOG Performance Status: 1 - Symptomatic but completely ambulatory  Medications:  Allergies as of 05/09/2020   No Known Allergies     Medication List       Accurate as of May 09, 2020  9:36 AM. If you have any questions, ask your nurse or doctor.        acetaminophen 500 MG tablet Commonly known as: TYLENOL Take 2 tablets (1,000 mg total) by mouth every 6 (six) hours as needed.   capecitabine 500 MG tablet Commonly known as: XELODA Take 1,500 mg by mouth 2 (two) times daily after a meal. Take for 14 days, then hold for 7 days. Repeat every 21 days.   famotidine 20 MG tablet Commonly known as: PEPCID Take 1 tablet (20 mg total) by mouth 2 (two) times daily.   fluticasone 50 MCG/ACT nasal spray Commonly known as: FLONASE Place 2 sprays into both nostrils daily.   gabapentin 300 MG capsule Commonly known as: NEURONTIN Take 1 capsule (300 mg total) by mouth 2 (two) times daily.     glipiZIDE 5 MG tablet Commonly known as: GLUCOTROL Take 0.5 tablets (2.5 mg total) by mouth daily before breakfast.   glucose monitoring kit monitoring kit 1 each by Does not apply route daily as needed for other. 1 month Diabetic Testing Supplies for QAC-QHS accuchecks.   insulin glargine 100 UNIT/ML Solostar Pen Commonly known as: LANTUS Inject 10 Units into the skin daily at 10 pm.   lidocaine-prilocaine cream Commonly known as: EMLA Apply to affected area once   metFORMIN 500 MG tablet Commonly known as: GLUCOPHAGE Take 2 tablets (1,000 mg total) by mouth 2 (two) times daily with a meal.   methocarbamol 500 MG tablet Commonly known as: Robaxin Take 2 tablets (1,000 mg total) by mouth every 8 (eight) hours as needed for muscle spasms.   ondansetron 8 MG tablet Commonly known as: Zofran Take 1 tablet (8 mg total) by mouth 2 (two) times daily as needed for refractory nausea / vomiting. Start on day 3 after chemotherapy.       Allergies: No Known Allergies  Past Medical History, Surgical history, Social history, and Family History were reviewed and updated.  Review of Systems: All other 10 point review of systems is negative.   Physical Exam:  weight is 151 lb (68.5 kg). His oral temperature is 97.6 F (36.4 C). His blood pressure is 152/80 (abnormal) and his pulse is 78. His respiration is 18 and oxygen saturation is 100%.   Wt Readings   from Last 3 Encounters:  05/09/20 151 lb (68.5 kg)  04/10/20 156 lb (70.8 kg)  03/20/20 155 lb 0.6 oz (70.3 kg)    Ocular: Sclerae unicteric, pupils equal, round and reactive to light Ear-nose-throat: Oropharynx clear, dentition fair Lymphatic: No cervical, supraclavicular or axillary adenopathy Lungs no rales or rhonchi, good excursion bilaterally Heart regular rate and rhythm, no murmur appreciated Abd soft, nontender, positive bowel sounds, no liver or spleen tip palpated on exam, no fluid wave  MSK no focal spinal  tenderness, no joint edema Neuro: non-focal, well-oriented, appropriate affect Breasts: Deferred   Lab Results  Component Value Date   WBC 6.6 05/09/2020   HGB 15.8 05/09/2020   HCT 44.1 05/09/2020   MCV 90.2 05/09/2020   PLT 143 (L) 05/09/2020   Lab Results  Component Value Date   FERRITIN 64 01/10/2020   IRON 20 (L) 01/10/2020   TIBC 308 01/10/2020   UIBC 287 01/10/2020   IRONPCTSAT 7 (L) 01/10/2020   Lab Results  Component Value Date   RBC 4.89 05/09/2020   No results found for: KPAFRELGTCHN, LAMBDASER, KAPLAMBRATIO No results found for: IGGSERUM, IGA, IGMSERUM No results found for: TOTALPROTELP, ALBUMINELP, A1GS, A2GS, BETS, BETA2SER, GAMS, MSPIKE, SPEI   Chemistry      Component Value Date/Time   NA 133 (L) 04/10/2020 1019   NA 137 02/14/2020 1420   K 4.1 04/10/2020 1019   CL 100 04/10/2020 1019   CO2 25 04/10/2020 1019   BUN 13 04/10/2020 1019   BUN 12 02/14/2020 1420   CREATININE 0.72 04/10/2020 1019      Component Value Date/Time   CALCIUM 9.0 04/10/2020 1019   ALKPHOS 135 (H) 04/10/2020 1019   AST 38 04/10/2020 1019   ALT 37 04/10/2020 1019   BILITOT 0.5 04/10/2020 1019       Impression and Plan: Dakota Washington is a very pleasant 64 yo Hispanic gentleman with stage IIIB (pT4a,pN1c,M0) adenocarcinoma of the sigmoid colon, MSI stable.  He is doing well with treatment but has had issues with elevated blood glucose and diarrhea.  I spoke with Dr. Ennever and we will hold treatment today and plan to resume in 3 weeks.  He will continue taking his Xeloda as prescribed.  His next treatment will be his last and we will repeat scans 6 weeks after.  I went over all this with both he and his son and they verbalized understanding.  He will also try imodium if needed for any diarrhea.  We will see him in 3 weeks on 6/25.  They promise to contact our office with any questions or concerns. We can certainly see him sooner if needed.   Sarah [place],  NP 6/4/20219:36 AM  

## 2020-05-09 NOTE — Addendum Note (Signed)
Addended by: Shelda Altes on: 05/09/2020 10:23 AM   Modules accepted: Orders

## 2020-05-14 DIAGNOSIS — E1165 Type 2 diabetes mellitus with hyperglycemia: Secondary | ICD-10-CM

## 2020-05-14 NOTE — Patient Instructions (Signed)
Hiperglucemia Hyperglycemia La hiperglucemia se produce cuando el nivel de azcar (glucosa) en la sangre es demasiado alto. La glucosa es un tipo de azcar que constituye la principal fuente de energa del cuerpo. Determinadas hormonas, como la insulina y Secretary/administrator, controlan la glucosa en la [place]. La insulina baja la glucemia, mientras que el glucagn la [place]. La hiperglucemia puede ser el resultado de una cantidad muy escasa de insulina en el torrente sanguneo o de una respuesta anmala del organismo a la insulina. La mayora de las veces, aparece en personas que tienen diabetes (diabetes mellitus), aunque puede manifestarse en personas que no padecen esta enfermedad. Se puede desarrollar rpidamente y, cuando provoca deshidratacin aguda (cetoacidocis diabtica o estado hiperosmolar hiperglucmico ), puede ser potencialmente mortal. La hiperglucemia es una emergencia mdica. Cules son las causas? Si tiene diabetes, las causas de la hiperglucemia pueden ser las siguientes:  Medicamentos para la diabetes.  Medicamentos que aumentan la glucemia o que afectan el control de la diabetes.  No comer lo suficiente o no comer con la frecuencia suficiente.  Cambios en el nivel de la actividad fsica.  Estar enfermo o tener una infeccin. Si tiene prediabetes o una diabetes no diagnosticada:  Estas afecciones pueden causar la hiperglucemia. Si no tiene diabetes, las causas de la hiperglucemia pueden ser las siguientes:  Algunos medicamentos, incluidos los corticoesteroides, betabloqueantes, epinefrina y diurticos tiazdicos.  Estrs.  Enfermedad grave.  Ciruga.  Enfermedades del pncreas.  Infeccin. Qu incrementa el riesgo? Es ms probable que una persona con factores de riesgo de diabetes, tenga hiperglucemia; por ejemplo:  Tener un familiar con diabetes.  Ser portador de un gen de la diabetes tipo1 que se transmite de padres a hijos (hereditario).  Vivir en una zona  con tiempo fro.  Exposicin a determinados virus.  Algunas afecciones en las que el sistema del cuerpo encargado de combatir las enfermedades (inmunitario) se ataca a s mismo (trastornos autoinmunitarios).  Tener exceso de [place] u obesidad.  Tener un estilo de vida inactivo (sedentario).  Haber sido diagnosticado con resistencia a la insulina.  Tener antecedentes de prediabetes, diabetes gestacional o sndrome de ovario poliqustico (SOP).  Ser descendiente de indgenas norteamericanos, afroamericanos, hispanos o latinos, o asiticos o isleos del Pacfico. Cules son los signos o los sntomas? Es posible que la hiperglucemia no cause ningn sntoma. Si tiene sntomas, estos pueden incluir signos tempranos de advertencia, como los siguientes:  Aumento de la sed.  Hambre.  Mucho cansancio.  Necesidad de Garment/textile technologist con mayor frecuencia que lo habitual.  Visin borrosa. Cuando la hiperglucemia empeora, pueden aparecer otros sntomas, como los siguientes:  [place].  Prdida del apetito.  Aliento con US[employer] a fruta.  Debilidad.  Aumento o prdida de peso repentinos o rpidos.  Hormigueo o adormecimiento de [employer].  Dolor de [place].  Piel que no vuelve rpidamente a la normalidad cuando se la suelta luego de Nutritional therapist (persistencia del pliegue cutneo).  Dolor abdominal.  Cortes o moretones que tardan en sanarse. Cmo se diagnostica? La hiperglucemia se diagnostica con un anlisis de sangre para medir el nivel de glucemia. Este anlisis de sangre se realiza normalmente cuando tiene los sntomas. El mdico tambin puede hacerle un examen fsico y revisar sus antecedentes mdicos. Es posible que deba hacerse otros estudios para determinar la causa de su hiperglucemia, como, por ejemplo:  Medicin de la glucemia en ayunas. No se le permitir comer (tendr que estar en ayunas) durante al menos 8horas antes de que  se le tome una [place] de  [place].  Un anlisis de sangre de A1c (hemoglobina A1c). Este anlisis proporciona informacin sobre el control de la glucemia durante los ltimos 2o37meses.  Prueba de tolerancia a la glucosa oral (PTGO). Esta prueba mide la glucemia en dos momentos: ? Despus de ayunar. Este es el valor inicial de la glucemia. ? Dos horas despus de tomar una bebida que contiene glucosa. Cmo se trata? El tratamiento depende de la causa de la hiperglucemia. El tratamiento puede incluir lo siguiente:  Medicamentos para regular los niveles de glucemia. Si recibe insulina u otro medicamento para la diabetes, es posible que se deba corregir la dosis o Dentist.  Cambios en el estilo de vida, como hacer ms actividad fsica, comer alimentos ms saludables o bajar de [place].  El tratamiento de la enfermedad o la infeccin, si son la causa de la hiperglucemia.  Control de la glucemia con mayor frecuencia.  La suspensin o la reduccin de los corticoesteroides, si son la causa de la hiperglucemia. Si la hiperglucemia se agrava y [place] un estado hiperosmolar hiperglucmico, deber ser hospitalizado y recibir lquidos por va intravenosa (IV). Siga estas indicaciones en su casa:  Instrucciones generales  [employer] de venta libre y los recetados solamente como se lo haya indicado el mdico.  No consuma ningn producto que contenga nicotina o tabaco, como cigarrillos y Psychologist, sport and exercise. Si necesita ayuda para dejar de fumar, consulte al mdico.  Limite el consumo de alcohol a no ms de 34medida por da si es mujer y no est [place], y a 1medidas si es hombre. Una medida equivale a 12oz (368ml) de cerveza, 5oz (142ml) de vino o 1oz (59ml) de bebidas alcohlicas de alta graduacin.  Aprenda a Engineer, maintenance (IT). Si necesita ayuda para lograrlo, consulte a su mdico.  Concurra a todas las visitas de seguimiento como se lo haya indicado el mdico. Esto es importante. Comida  y bebida   Mantenga un peso saludable.  Haga ejercicio regularmente como se lo haya indicado el mdico.  Mantngase hidratado, especialmente cuando haga actividad fsica, est enfermo o pase tiempo en temperaturas elevadas.  Consuma alimentos saludables, por ejemplo: ? Protenas magras. ? Hidratos de carbono complejos. ? [place] M[place] y verduras frescas. ? Productos lcteos descremados. ? Grasas saludables.  Beba suficiente lquido para Consulting civil engineer orina clara o de color amarillo plido. Si usted tiene diabetes:  Asegrese de Charity fundraiser de la hiperglucemia.  Siga el plan de control de la diabetes como se lo haya indicado el mdico. Haga lo siguiente: ? Adminstrese la insulina y los medicamentos como se le indic. ? Siga el plan de ejercicio. ? Siga el plan de comidas. Coma a horario y no se saltee comidas. ? Contrlese la glucemia con la frecuencia que le hayan indicado. Contrlese la glucemia antes y despus de hacer actividad fsica. Si hace ejercicio durante ms tiempo o de [employer] de lo habitual, contrlese la glucemia con mayor frecuencia. ? Siga el plan para los das de enfermedad cuando no pueda comer o beber con normalidad. Cree este plan por adelantado con el mdico.  Comparta su plan de control de la diabetes con sus compaeros de trabajo y de Cytogeneticist, y con las otras personas que viven en su hogar.  Contrlese las cetonas en la orina cuando est enfermo y [place] se lo haya indicado el mdico.  Lleve una tarjeta de alerta mdica o use un brazalete o medalla de alerta mdica. Comunquese con un  mdico si:  El nivel de glucemia es mayor o igual que 240mg /dl (13,60mmol/dl) durante 2das seguidos.  Tiene problemas para mantener la glucemia dentro del rango ideal.  Tiene episodios frecuentes de hiperglucemia. Solicite ayuda de inmediato si:  Tiene dificultad para respirar.  Tiene cambios en la manera de sentirse, pensar o actuar (estado  mental).  Tiene nuseas o vmitos que no desaparecen. Estos sntomas pueden representar un problema grave que constituye Engineer, maintenance (IT). No espere hasta que los sntomas desaparezcan. Solicite atencin mdica de inmediato. Comunquese con el servicio de emergencias de su localidad (911 en los [place]). No conduzca por sus propios medios [employer] hospital. Resumen  La hiperglucemia se produce cuando el nivel de azcar (glucosa) en la sangre es demasiado alto.  La hiperglucemia se diagnostica con un anlisis de sangre para medir el nivel de glucemia. Este anlisis de sangre se realiza normalmente cuando tiene los sntomas. El mdico tambin puede hacerle un examen fsico y revisar sus antecedentes mdicos.  Si tiene diabetes, siga el plan de control de la diabetes como se lo haya indicado el mdico.  Comunquese con el mdico si tiene problemas para Theatre manager la glucemia dentro del intervalo deseado. Esta informacin no tiene Marine scientist el consejo del mdico. Asegrese de hacerle al mdico cualquier pregunta que tenga. Document Revised: 07/19/2017 Document Reviewed: 07/29/2015 Elsevier Patient Education  [place]. O diabetes mellitus e a nutrio, adultos Diabetes Mellitus and Nutrition, Adult Quando voc sofre de diabetes (diabetes mellitus),  muito importante ter hbitos de alimentao saudveis, uma vez que os seus nveis de acar no sangue (glicose) so significativamente afetados pelo que voc come e bebe. Comer alimentos saudveis nas quantidades apropriadas e aproximadamente nos mesmos horrios todos os dias pode ajudar voc a:  Controlar seu nvel de glicose sangunea.  Reduzir seu risco de doena cardaca.  Melhorar sua presso arterial.  Alcanar ou manter um peso saudvel. Todas as pessoas com diabetes so diferentes, e cada pessoa tem diferentes necessidades em termos de um plano de refeies. Seu mdico poder recomendar que voc colabore com um  especialista em nutrio e dieta (nutricionista) para elaborar o melhor plano de refeies para voc. Seu plano de refeies poder variar dependendo de fatores como:  As calorias de que voc necessita.  Os medicamentos que toma.  Seu peso.  Seus nveis de glicose sangunea, presso arterial e colesterol.  Seu nvel de atividade.  Outros quadros clnicos, como doena cardaca ou renal. Como os carboidratos me afetam? Os carboidratos, tambm chamado de acares, afetam o nvel de [employer] do que qualquer outro tipo de [place]. A ingesto de carboidratos aumenta naturalmente a quantidade de glicose no seu sangue. A contagem de carboidratos  um mtodo para acompanhar a quantidade de carboidratos que voc ingere. A contagem de carboidratos  importante para manter sua glicose sangunea em um nvel saudvel, principalmente se voc [place] insulina ou toma certos medicamentos orais para o diabetes.  importante saber quanto de carboidrato voc pode ingerir com segurana em cada refeio. Isso varia de pessoa para pessoa. Seu nutricionista poder ajud-lo a calcular quanto de carboidrato voc deve consumir em cada refeio e em cada lanche. Alimentos que contm carboidratos incluem:  Po, cereais, arroz, massas e bolachas.  Batatas e milho.  Ervilhas, feijo e lentilhas.  Leite e iogurte.  Frutas e sucos.  Sobremesas, como bolos, biscoitos, sorvete e doces. Como o lcool me afeta? O lcool pode causar uma sbita reduo do nvel de glicose sangunea (hipoglicemia),  especialmente se voc usar insulina ou tomar certos medicamentos orais para o diabetes. A hipoglicemia pode ser um quadro clnico potencialmente fatal. Os sintomas de hipoglicemia (sonolncia, vertigem e confuso) so parecidos com os sintomas da ingesto abusiva de lcool. Caso seu mdico diga que o lcool  seguro para voc, siga essas orientaes:  Limite o consumo de bebidas alcolicas a, no mximo, 1 dose por  dia para mulheres no grvidas e 2 doses por dia para homens. Uma dose equivale a 12 onas de cerveja, 5 onas de vinho ou 1 ona de bebida destilada.  No beba de estmago vazio.  Mantenha sua hidratao com gua, refrigerante diet ou ch gelado sem acar.  Tenha em mente que o refrigerante normal, suco e outras bebidas podem conter muito acar e devem ser contadas como carboidratos. Quais so as dicas para seguir este plano?  Leia os rtulos dos alimentos  Comece verificando o Higher education careers adviser poro nas "Informaes nutricionais" no rtulo de alimentos e bebidas embalados. A quantidade de calorias, carboidratos, gorduras e outros nutrientes listados no rtulo se baseia em uma poro padro do alimento. Muitos itens contm mais de uma poro por embalagem.  Verifique o total de gramas (g) de carboidratos contidos em uma poro. Voc pode calcular o nmero de pores de carboidratos em uma poro dividindo o total de carboidratos por 15. Por exemplo: supondo que um alimento contenha um total de 30 g de carboidratos, isso seria igual a 2 pores de carboidratos.  Verifique o nmero de gramas (g) de gorduras saturadas e trans em uma poro. Escolha alimentos com pouca ou nenhuma quantidade dessas gorduras.  Verifique a quantidade de miligramas (mg) de sal (sdio) em uma poro. A maioria das pessoas deve limitar o consumo de sdio a 2.300 mg por dia.  Sempre verifique as informaes nutricionais dos alimentos rotulados como de "baixo teor de gordura" e "gordura zero". Esses alimentos podem conter elevado teor de acar de adio ou carboidratos refinados e devem ser evitados.  Converse com seu nutricionista para identificar suas metas dirias dos nutrientes listados na tabela. No mercado  Evite comprar alimentos enlatados, semiprontos ou processados. Esses alimentos tendem a conter elevado teor de gordura, sdio e acares adicionados.  Escolha itens das reas mais externas da seo de  alimentos. Ela inclui frutas e verduras frescas, cereais a granel, carnes frescas e produtos avirios frescos. Na cozinha  Use mtodos de cozimento de baixo calor, como assar, em vez de mtodos de cozimento de Advertising account planner, como fritura por imerso.  Cozinhe com leos saudveis para o corao, como de [place], canola ou [place].  Evite cozinhar com manteiga, creme ou carnes com elevado teor de [place]. O planejamento das refeies  Consuma refeies e lanches regularmente, de preferncia nos mesmos horrios todos os dias. Evite ficar longos perodos sem comer.  Coma alimentos ricos em [place], como frutas e verduras frescas, feijo e gros integrais. Converse com seu nutricionista sobre quantas pores de carboidratos voc pode comer em cada refeio.  Coma de 4-6 onas de protena magra por dia, como carne magra, frango, peixe, ovos ou tofu. Uma ona de protena magra  igual a: ? 1 ona de carne, frango ou peixe. ? 1 ovo. ?  xcara de tofu.  Coma alguns alimentos todos os dias que contenham gorduras saudveis, como abacate, nozes, sementes e peixe. Estilo de vida  Verifique sua glicose sangunea regularmente.  Exercite-se regularmente de acordo com as indicaes do seu mdico. Isso pode incluir: ? 150 minutos de exerccios de intensidade  moderada ou de intensidade vigorosa por semana. Pode ser uma caminhada leve, andar de bicicleta ou fazer hidroginstica. ? Alongar e Optometrist exerccios de fora, como ioga ou musculao, pelo menos 2 vezes por semana.  Tome medicamentos somente de acordo com as orientaes do seu mdico.  No use nenhum produto que contenha nicotina ou tabaco, como cigarros tradicionais e cigarros eletrnicos. Caso precise de ajuda para parar de fumar, fale com seu mdico.  Consulte-se com um especialista em diabetes para identificar estratgias para lidar com o estresse e quaisquer desafios emocionais ou sociais. Perguntas a fazer ao mdico  Preciso me consultar  com um especialista em diabetes?  Preciso me consultar com um nutricionista?  Para que nmero devo ligar se tiver dvidas?  Quais so os melhores horrios para verificar minha glicose sangunea? Onde conseguir mais informaes:  Associao de Diabetes Cherlyn Cushing (American Diabetes Association, ADA): diabetes.org  Academia de Nutrio e Diettica (Academy of Nutrition and Dietetics): www.eatright.[employer] of Diabetes and Digestive and Kidney Diseases ([place] Diabetes e Doenas Digestivas e Renais): [employer].dk Resumo  Um plano de refeies saudveis ajudar voc a controlar sua glicose sangunea e a manter um estilo de vida saudvel.  Consultar um especialista em nutrio (nutricionista) poder ajudar na elaborao do melhor plano para voc.  Tenha em mente que carboidratos (acares) e lcool tm efeitos imediatos sobre seus nveis de glicose sangunea.  importante contar os carboidratos e consumir lcool com cautela. Estas informaes no se destinam a substituir as recomendaes de seu mdico. No deixe de discutir quaisquer dvidas com seu mdico. Document Revised: 08/02/2017 Document Reviewed: 03/24/2017 Elsevier Patient Education  2020 [employer].
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
## 2020-05-19 DIAGNOSIS — R809 Proteinuria, unspecified: Secondary | ICD-10-CM | POA: Insufficient documentation

## 2020-05-19 DIAGNOSIS — E1129 Type 2 diabetes mellitus with other diabetic kidney complication: Secondary | ICD-10-CM | POA: Insufficient documentation

## 2020-05-29 DIAGNOSIS — E11649 Type 2 diabetes mellitus with hypoglycemia without coma: Secondary | ICD-10-CM

## 2020-05-29 DIAGNOSIS — Z95828 Presence of other vascular implants and grafts: Secondary | ICD-10-CM

## 2020-05-29 DIAGNOSIS — R739 Hyperglycemia, unspecified: Secondary | ICD-10-CM

## 2020-05-29 DIAGNOSIS — C187 Malignant neoplasm of sigmoid colon: Secondary | ICD-10-CM

## 2020-05-29 DIAGNOSIS — D508 Other iron deficiency anemias: Secondary | ICD-10-CM

## 2020-05-29 NOTE — Progress Notes (Signed)
Hematology and Oncology Follow Up Visit  [place] Dakota Washington [id number] July 15, 1956 64 y.o. 05/29/2020   Principle Diagnosis:  Stage IIIB (pT4a,pN1c,M0) adenocarcinoma of the sigmoid colon, MSI stable  Iron deficiency anemia  Past Therapy: Lap sigmoid colectomy with re-anastomosis - 12/31/2019  Current Therapy: Adjuvant CAPOX x 8 cycles (tentatively), s/p cycle 4 IV iron as indicated    Interim History:  Dakota Washington is here today with his son for follow-up and treatment.  This is the first time that I am seeing him.  He is a nice 64 year old Hispanic male from [place].  He comes in with his son.  He has stage IIIb colon cancer.  He is on CAPEOX.  His problem is his diabetes.  I really not sure anybody is is even monitoring this for him.  His blood sugar was almost 500 today.  He says he checks it at home.  Last time he checked it 2 days ago it was 370.  I told he and his son that if his blood sugar is not under better control, he will likely pass on from complications of diabetes.  In addition, his diabetes is probably decreasing the effectiveness of his chemotherapy.  He urinates quite a lot.  Again this is all secondary to his diabetes.  He is on 10 units of insulin at home.  I told increase to 15 units of insulin at home.  I told the son that he must see his family doctor to get the diabetes under better control.  He has had no diarrhea.  He has had no cough or shortness of breath.  He has had some tingling in the hands and feet.  Again I suspect this probably is from the diabetes.  We probably need to set him up with some scans to make sure that everything looks okay.  Currently, I would say his performance status is probably ECOG 1.    Medications:  Allergies as of 05/29/2020   No Known Allergies     Medication List       Accurate as of May 29, 2020 11:45 AM. If you have any questions, ask your nurse or doctor.        acetaminophen 500 MG  tablet Commonly known as: TYLENOL Take 2 tablets (1,000 mg total) by mouth every 6 (six) hours as needed.   famotidine 20 MG tablet Commonly known as: PEPCID Take 1 tablet (20 mg total) by mouth 2 (two) times daily.   glipiZIDE 5 MG tablet Commonly known as: GLUCOTROL Take 0.5 tablets (2.5 mg total) by mouth daily before breakfast.   glucose monitoring kit monitoring kit 1 each by Does not apply route daily as needed for other. 1 month Diabetic Testing Supplies for QAC-QHS accuchecks.   insulin glargine 100 UNIT/ML Solostar Pen Commonly known as: LANTUS Inject 15 Units into the skin 2 (two) times daily.   metFORMIN 500 MG tablet Commonly known as: GLUCOPHAGE Take 2 tablets (1,000 mg total) by mouth 2 (two) times daily with a meal.       Allergies: No Known Allergies  Past Medical History, Surgical history, Social history, and Family History were reviewed and updated.  Review of Systems: Review of Systems  Constitutional: Negative.   HENT: Negative.   Eyes: Positive for blurred vision.  Respiratory: Negative.   Cardiovascular: Negative.   Genitourinary: Positive for frequency.  Musculoskeletal: Negative.   Skin: Negative.   Neurological: Positive for tingling.  Endo/Heme/Allergies: Negative.   Psychiatric/Behavioral: Negative.    Marland Kitchen  Physical Exam:  height is 5' 6"  (1.676 m) and weight is 149 lb 1.9 oz (67.6 kg). His temporal temperature is 97.1 F (36.2 C) (abnormal). His blood pressure is 140/70 and his pulse is 68. His respiration is 20 and oxygen saturation is 100%.   Wt Readings from Last 3 Encounters:  05/29/20 149 lb 1.9 oz (67.6 kg)  05/14/20 149 lb 6.4 oz (67.8 kg)  05/09/20 151 lb (68.5 kg)    Physical Exam Vitals reviewed.  HENT:     Head: Normocephalic and atraumatic.  Eyes:     Pupils: Pupils are equal, round, and reactive to light.  Cardiovascular:     Rate and Rhythm: Normal rate and regular rhythm.     Heart sounds: Normal heart sounds.   Pulmonary:     Effort: Pulmonary effort is normal.     Breath sounds: Normal breath sounds.  Abdominal:     General: Bowel sounds are normal.     Palpations: Abdomen is soft.  Musculoskeletal:        General: No tenderness or deformity. Normal range of motion.     Cervical back: Normal range of motion.  Lymphadenopathy:     Cervical: No cervical adenopathy.  Skin:    General: Skin is warm and dry.     Findings: No erythema or rash.  Neurological:     Mental Status: He is alert and oriented to person, place, and time.  Psychiatric:        Behavior: Behavior normal.        Thought Content: Thought content normal.        Judgment: Judgment normal.      Lab Results  Component Value Date   WBC 5.7 05/29/2020   HGB 15.4 05/29/2020   HCT 43.0 05/29/2020   MCV 91.1 05/29/2020   PLT 243 05/29/2020   Lab Results  Component Value Date   FERRITIN 64 01/10/2020   IRON 20 (L) 01/10/2020   TIBC 308 01/10/2020   UIBC 287 01/10/2020   IRONPCTSAT 7 (L) 01/10/2020   Lab Results  Component Value Date   RBC 4.72 05/29/2020   No results found for: KPAFRELGTCHN, LAMBDASER, KAPLAMBRATIO No results found for: IGGSERUM, IGA, IGMSERUM No results found for: Dakota Washington, SPEI   Chemistry      Component Value Date/Time   NA 131 (L) 05/09/2020 0916   NA 137 02/14/2020 1420   K 4.2 05/09/2020 0916   CL 98 05/09/2020 0916   CO2 24 05/09/2020 0916   BUN 15 05/09/2020 0916   BUN 12 02/14/2020 1420   CREATININE 0.76 05/09/2020 0916      Component Value Date/Time   CALCIUM 9.7 05/09/2020 0916   ALKPHOS 227 (H) 05/09/2020 0916   AST 40 05/09/2020 0916   ALT 45 (H) 05/09/2020 0916   BILITOT 0.6 05/09/2020 0916       Impression and Plan: Dakota Washington is a very pleasant 64 yo Hispanic gentleman with stage IIIB (pT4a,pN1c,M0) adenocarcinoma of the sigmoid colon, MSI stable.   We will try to go with 1 final cycle of treatment.   Hopefully, he is doing the Xeloda.  We will set him up with some scans in about 2 months.  I know that there is a risk of recurrence.  However, I just worry that his prognosis will be based upon his diabetes.  He does seem to be a real nice guy.  His son is definitely a big help.  Hopefully, the blood sugars will be under better control when we see him back.  I will give him some insulin in the office today.  I probably spent about 45 minutes with him today.  Most the time we had his try to deal with his diabetes.   Volanda Napoleon, MD 6/24/202111:45 AM

## 2020-05-29 NOTE — Progress Notes (Signed)
Oncology Nurse Navigator Documentation  Oncology Nurse Navigator Flowsheets 05/29/2020  Abnormal Finding Date -  Confirmed Diagnosis Date -  Diagnosis Status -  Planned Course of Treatment -  Phase of Treatment -  Chemotherapy Actual Start Date: -  Navigator Follow Up Date: -  Navigator Follow Up Reason: -  Navigation Complete Date: 05/29/2020  Post Navigation: Continue to Follow Patient? No  Reason Not Navigating Patient: Patient On Maintenance Chemotherapy  Navigator Location [facility]  Referral Date to RadOnc/MedOnc -  Navigator Encounter Type Appt/Treatment Plan Review  Telephone -  [facility] Date -  Multidisiplinary Clinic Type -  Treatment Initiated Date -  Patient Visit Type -  Treatment Phase Active Tx  Barriers/Navigation Needs No Barriers At This Time  Education -  Interventions -  Acuity -  Referrals -  Coordination of Care -  Education Method -  Support Groups/Services -  Time Spent with Patient 15

## 2020-05-29 NOTE — Patient Instructions (Signed)

## 2020-05-29 NOTE — Patient Instructions (Signed)
Oxaliplatin Injection Qu es este medicamento? El OXALIPLATINO es un agente quimioteraputico. Este medicamento acta sobre las clulas que se dividen rpidamente, como las clulas cancerosas, y finalmente provoca la muerte de estas clulas. Se utiliza en el tratamiento del cncer de colon y recto y 38 tipos de cncer. Este medicamento puede ser utilizado para otros usos; si tiene alguna pregunta consulte con su proveedor de atencin mdica o con su farmacutico. MARCAS COMUNES: Eloxatin Qu le debo informar a mi profesional de la salud antes de tomar este medicamento? Necesita saber si usted presenta alguno de los siguientes problemas o situaciones:  enfermedad renal  una reaccin alrgica o inusual al oxaliplatino, a otros agentes quimioteraputicos, a otros medicamentos, alimentos, colorantes o conservantes  si est embarazada o buscando quedar embarazada  si est amamantando a un beb Cmo debo utilizar este medicamento? Este medicamento se administra mediante infusin por va intravenosa. Lo administra un profesional de la salud calificado en un hospital o en un entorno clnico. Hable con su pediatra para informarse acerca del uso de este medicamento en nios. Puede requerir atencin especial. Sobredosis: Pngase en contacto inmediatamente con un centro toxicolgico o una sala de urgencia si usted cree que haya tomado demasiado medicamento. ATENCIN: [employer] es solo para usted. No comparta este medicamento con nadie. Qu sucede si me olvido de una dosis? Es importante no olvidar ninguna dosis. Informe a su mdico o a su profesional de la salud si no puede asistir a Photographer. Qu puede interactuar con este medicamento?  medicamentos para incrementar los conteos sanguneos, tales como filgrastim, pegfilgrastim, sargramostim  probenecid  ciertos antibiticos, tales como amicacina, gentamicina, neomicina, polimixina B, estreptomicina, tobramicina  zalcitabina Consulte  a su mdico o a su profesional de la salud antes de tomar cualquiera de los siguientes medicamentos:  acetaminofeno  aspirina  ibuprofeno  quetoprofeno  naproxeno Puede ser que esta lista no menciona todas las posibles interacciones. Informe a su profesional de [employer]	[place] de [employer] productos a base de hierbas, medicamentos de [place] o suplementos nutritivos que est tomando. Si usted fuma, consume bebidas alcohlicas o si utiliza drogas ilegales, indqueselo tambin a su profesional de [employer]	[place]. Algunas sustancias pueden interactuar con su medicamento. A qu debo estar atento al usar [employer]? Se supervisar su condicin atentamente mientras reciba este medicamento. Tendr que hacerse anlisis de sangre peridicos mientras reciba este medicamento. Este medicamento puede aumentar su sensibilidad al fro. No tomar bebidas fras o usar hielo. Cubra la piel expuesta antes de estar en contacto con temperaturas fras u objetos fros. Mientras se encuentra afuera cuando haga fro use ropa [place] su boca y su nariz para calentar el aire que entra en sus pulmones. Informe a su mdico si experimenta sensibilidad al fro. Este medicamento puede hacerle sentir un Nurse, mental health. Esto es normal ya que la quimioterapia afecta tanto a las clulas sanas como a las clulas cancerosas. Si presenta alguno de los [employer], infrmelos. Sin embargo, contine con el tratamiento aun si se siente enfermo, a menos que su mdico le indique que lo suspenda. En algunos casos, podr recibir [employer] para ayudarle con los efectos secundarios. Siga las instrucciones para usarlos. Consulte a su mdico o a su profesional de la salud por asesoramiento si tiene fiebre, escalofros, dolor de garganta o cualquier otro sntoma de resfro o gripe. No se trate usted mismo. Este medicamento puede reducir la capacidad del cuerpo para combatir infecciones. Trate de no acercarse a IT sales professional  estn enfermas. [employer] puede aumentar el riesgo de magulladuras o sangrado. Consulte a su mdico o a su profesional de la salud si observa sangrados inusuales. Proceda con cuidado al cepillar sus dientes, usar hilo dental o Risk manager palillos para los dientes, ya que puede contraer una infeccin o Therapist, art con mayor facilidad. Si se somete a algn tratamiento dental, informe a su dentista que est [employer]. Evite tomar productos que contienen aspirina, acetaminofeno, ibuprofeno, naproxeno o quetoprofeno a menos que as lo indique su mdico. Estos productos pueden disimular la fiebre. No se debe quedar embarazada mientras reciba este medicamento. Las mujeres deben informar a su mdico si estn buscando quedar embarazadas o si creen que estn embarazadas. Existe la posibilidad de efectos secundarios graves a un beb sin nacer. Para ms informacin hable con su profesional de la salud o su farmacutico. No debe Economist a un beb mientras reciba este medicamento. Si tiene diarrea, llame a su mdico o a su profesional de [employer]	[place]. No se trate usted mismo. Qu efectos secundarios puedo tener al [employer] este medicamento? Efectos secundarios que debe informar a su mdico o a Barrister's clerk de la salud tan pronto como sea posible:  Chief of Staff como erupcin cutnea, picazn o urticarias, hinchazn de la cara, labios o lengua  conteos sanguneos bajos - este medicamento puede reducir la cantidad de glbulos blancos, glbulos rojos y plaquetas. Su riesgo de infeccin y Alexis.  signos de infeccin - fiebre o escalofros, tos, dolor de garganta, Social research officer, government o dificultad para orinar  signos de reduccin de plaquetas o sangrado - magulladuras, puntos rojos en la piel, heces de color oscuro o con aspecto alquitranado, sangrado por la nariz  signos de reduccin de glbulos rojos - cansancio o debilidad inusual, desmayos, sensacin de Technical sales engineer u opresin en el pecho  tos  diarrea  tensin en la mandbula  llagas en la boca  nuseas, vmito  dolor, hinchazn, enrojecimiento o irritacin en el lugar de la inyeccin  dolor, hormigueo, entumecimiento de manos o pies  problemas de coordinacin, del habla, al caminar  enrojecimiento, formacin de ampollas, descamacin o distensin de la piel, inclusive dentro de la boca  dificultad para orinar o cambios en el volumen de orina Efectos secundarios que, por lo general, no requieren atencin mdica (debe informarlos a su mdico o a su profesional de la salud si persisten o si son molestos):  cambios en la visin  estreimiento  cada del cabello  prdida del apetito  sabor metlico en la boca o cambios en el sentido del gusto  dolor estomacal Puede ser que esta lista no menciona todos los posibles efectos secundarios. Comunquese a su mdico por asesoramiento mdico [employer]. Usted puede informar los efectos secundarios a la FDA por telfono al 1-800-FDA-1088. Dnde debo guardar mi medicina? Este medicamento se administra en hospitales o clnicas y no necesitar guardarlo en su domicilio. ATENCIN: Este folleto es un resumen. Puede ser que no cubra toda la posible informacin. Si usted tiene preguntas acerca de esta medicina, consulte con su mdico, su farmacutico o su profesional de Technical sales engineer.  2020 Elsevier/Gold Standard (2015-01-14 00:00:00)

## 2020-06-20 DIAGNOSIS — R531 Weakness: Secondary | ICD-10-CM | POA: Insufficient documentation

## 2020-06-20 DIAGNOSIS — R5383 Other fatigue: Secondary | ICD-10-CM | POA: Insufficient documentation

## 2020-06-20 DIAGNOSIS — E1165 Type 2 diabetes mellitus with hyperglycemia: Secondary | ICD-10-CM | POA: Insufficient documentation

## 2020-06-20 DIAGNOSIS — Z85038 Personal history of other malignant neoplasm of large intestine: Secondary | ICD-10-CM | POA: Insufficient documentation

## 2020-06-20 DIAGNOSIS — T50995A Adverse effect of other drugs, medicaments and biological substances, initial encounter: Secondary | ICD-10-CM | POA: Insufficient documentation

## 2020-06-20 DIAGNOSIS — R42 Dizziness and giddiness: Secondary | ICD-10-CM | POA: Insufficient documentation

## 2020-06-20 DIAGNOSIS — Z79899 Other long term (current) drug therapy: Secondary | ICD-10-CM | POA: Insufficient documentation

## 2020-06-20 DIAGNOSIS — R739 Hyperglycemia, unspecified: Secondary | ICD-10-CM

## 2020-06-20 DIAGNOSIS — I10 Essential (primary) hypertension: Secondary | ICD-10-CM | POA: Insufficient documentation

## 2020-06-20 DIAGNOSIS — Z794 Long term (current) use of insulin: Secondary | ICD-10-CM | POA: Insufficient documentation

## 2020-06-20 DIAGNOSIS — R109 Unspecified abdominal pain: Secondary | ICD-10-CM | POA: Insufficient documentation

## 2020-06-20 DIAGNOSIS — R112 Nausea with vomiting, unspecified: Secondary | ICD-10-CM | POA: Insufficient documentation
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
## 2020-06-20 NOTE — ED Triage Notes (Signed)
Per EMS, Pt took an unknown amount of CBD gummies apprx 2 hrs ago. Pt became very lethargic, had one episode of N/V. Pts CBG 432 with EMS, pt A&O 4x.

## 2020-06-21 NOTE — Discharge Instructions (Addendum)
Follow up with your doctor to establish better control of your blood sugar. It is important that you take your medications as prescribed. Check you blood sugar regularly.   Avoid CBD substances in the future.

## 2021-01-26 NOTE — Progress Notes (Signed)
..  The following Assist/Replace Program for [facility] from [employer] has been terminated due to assistance expired on 01/14/2021, no orders for active treatment for medication.  Last DOS: 08/29/2020.

## 2021-07-30 DIAGNOSIS — E119 Type 2 diabetes mellitus without complications: Secondary | ICD-10-CM | POA: Insufficient documentation

## 2021-07-30 DIAGNOSIS — Z20822 Contact with and (suspected) exposure to covid-19: Secondary | ICD-10-CM | POA: Insufficient documentation

## 2021-07-30 DIAGNOSIS — R079 Chest pain, unspecified: Secondary | ICD-10-CM | POA: Insufficient documentation

## 2021-07-30 DIAGNOSIS — I10 Essential (primary) hypertension: Secondary | ICD-10-CM | POA: Insufficient documentation

## 2021-07-30 DIAGNOSIS — Z794 Long term (current) use of insulin: Secondary | ICD-10-CM | POA: Insufficient documentation

## 2021-07-30 DIAGNOSIS — Z7984 Long term (current) use of oral hypoglycemic drugs: Secondary | ICD-10-CM | POA: Insufficient documentation

## 2021-07-30 DIAGNOSIS — R112 Nausea with vomiting, unspecified: Secondary | ICD-10-CM | POA: Insufficient documentation

## 2021-07-30 DIAGNOSIS — R0602 Shortness of breath: Secondary | ICD-10-CM | POA: Insufficient documentation
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
## 2021-07-30 IMAGING — CT CT ANGIO CHEST
2 of 6 series · 18 of 36 positions shown · IV contrast (omnipaque)
Comparison: Same day chest radiograph

CLINICAL DATA: Body aches, chills, fatigue for 9 days, chest pain
and shortness of breath

EXAM:
CT ANGIOGRAPHY CHEST WITH CONTRAST
TECHNIQUE: Multidetector CT imaging of the chest was performed using the
standard protocol during bolus administration of intravenous
contrast. Multiplanar CT image reconstructions and MIPs were
obtained to evaluate the vascular anatomy.
CONTRAST:  75mL OMNIPAQUE IOHEXOL 350 MG/ML SOLN

[Series 5: thins · axial · 0.75mm/px · z∈[+1302,+1558]mm · 17 of 290 slices shown]
[im 17/290  lung]
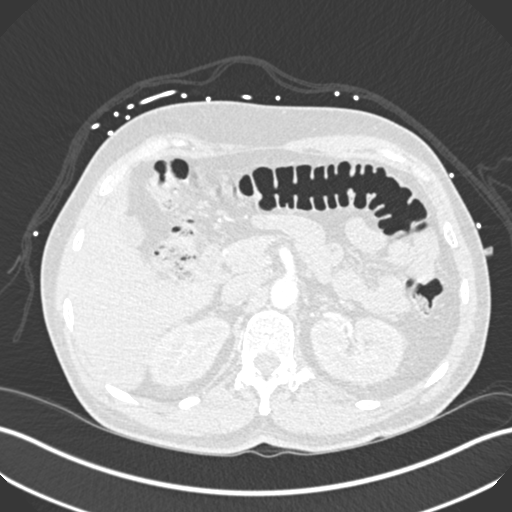
[im 33/290  mediastinal]
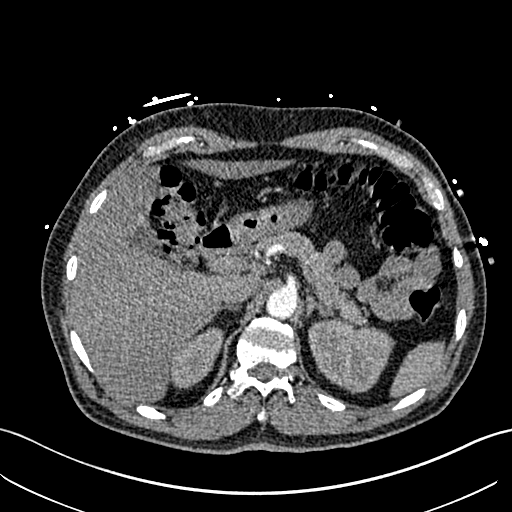
[im 49/290  lung]
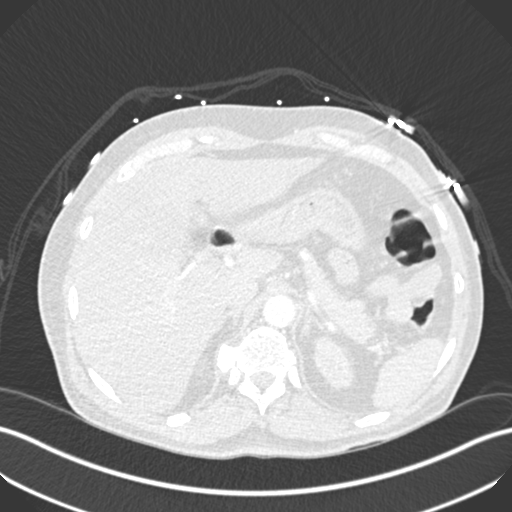
[im 65/290  mediastinal]
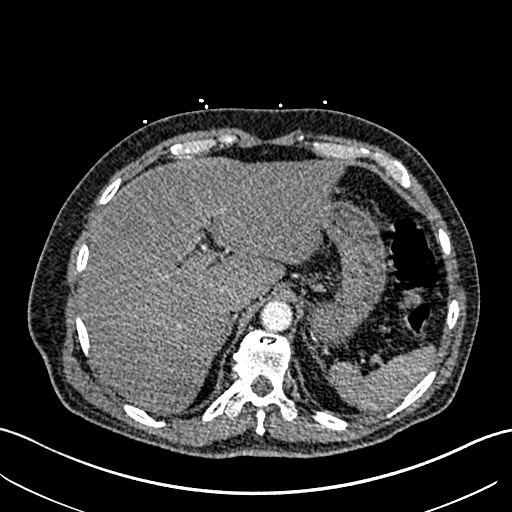
[im 81/290  lung]
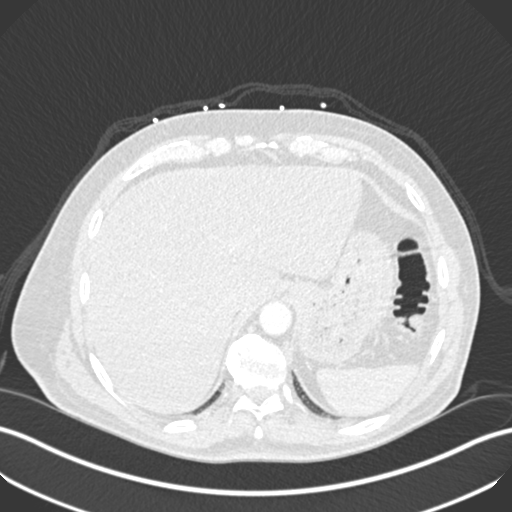
[im 97/290  mediastinal]
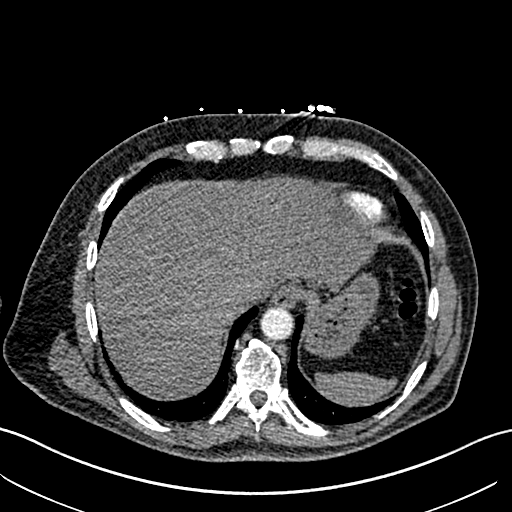
[im 113/290  lung]
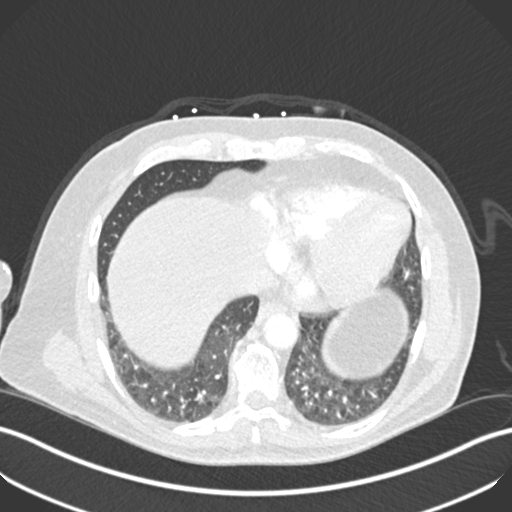
[im 129/290  mediastinal]
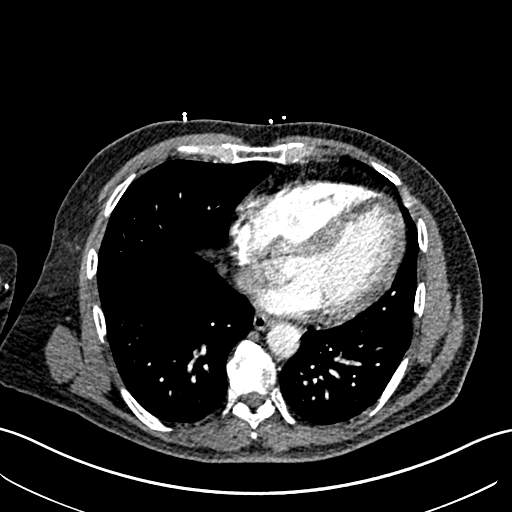
[im 145/290  lung]
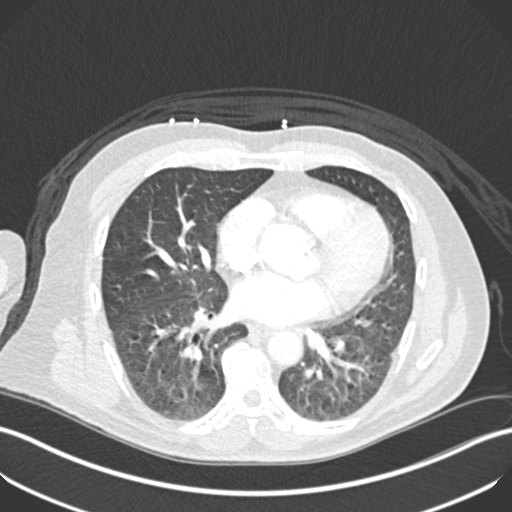
[im 161/290  mediastinal]
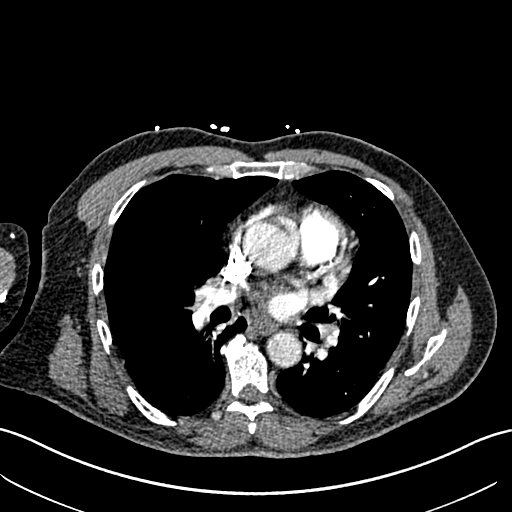
[im 177/290  lung]
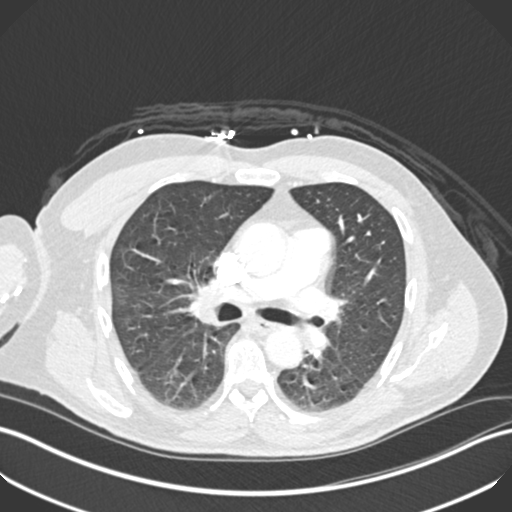
[im 193/290  mediastinal]
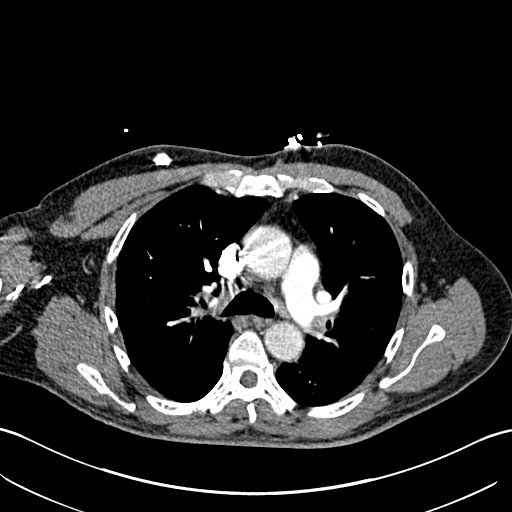
[im 209/290  lung]
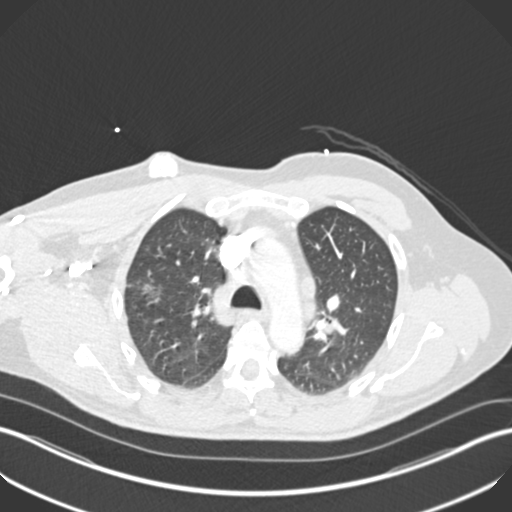
[im 225/290  mediastinal]
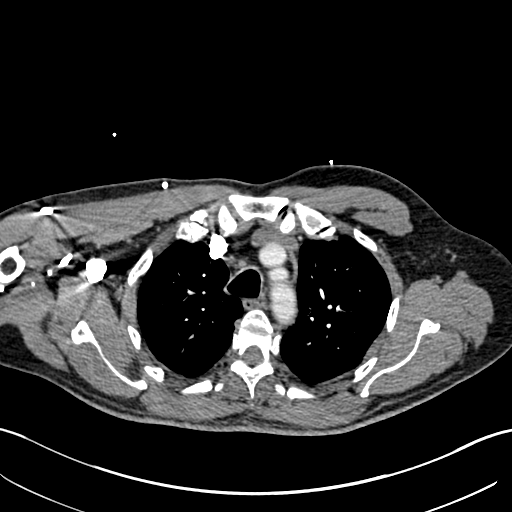
[im 241/290  lung]
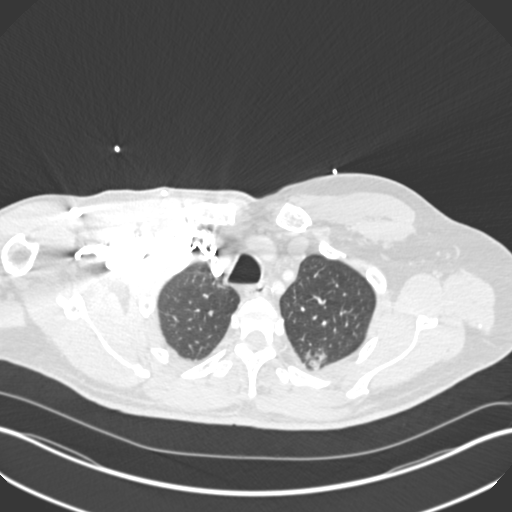
[im 257/290  mediastinal]
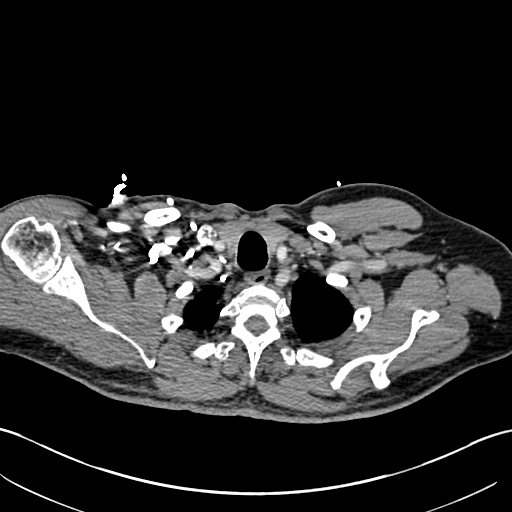
[im 273/290  lung]
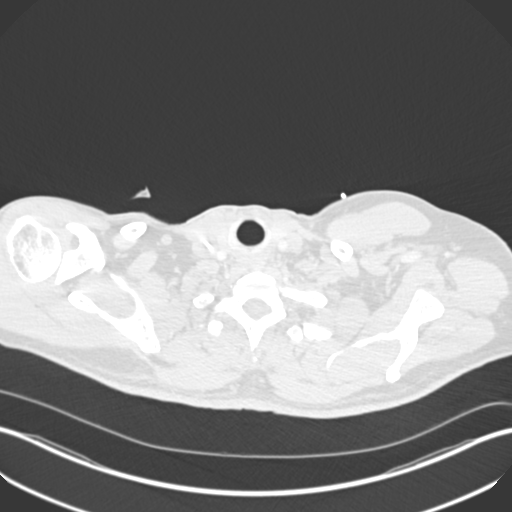

[Series 7: coronal mpr · coronal · 0.64mm/px · 1 of 146 slices shown]
[im 73/146  mediastinal]
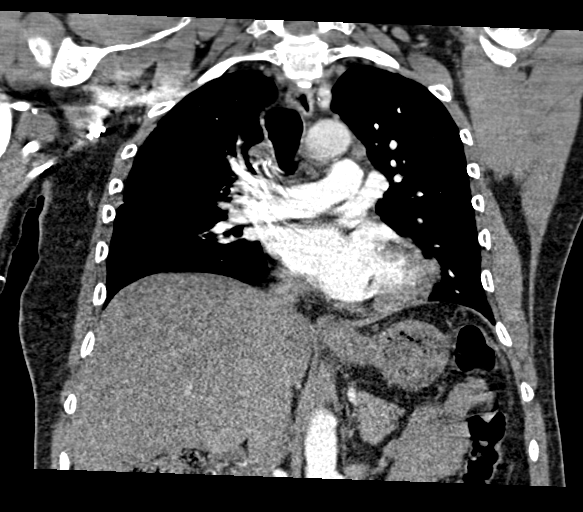

[18 of 36 positions shown; findings below may reference images not displayed]

FINDINGS: Cardiovascular: Satisfactory opacification of the pulmonary arteries
to the segmental level. No evidence of pulmonary embolism. The heart
is borderline enlarged. There is no pericardial effusion. A right
chest wall port is in place terminating near the cavoatrial
junction.

Mediastinum/Nodes: The thyroid is unremarkable. There are prominent
mediastinal lymph nodes measuring up to 1.2 cm in short axis. There
are prominent right hilar lymph nodes measuring up to 1.2 cm in
short axis. There are smaller left hilar lymph nodes measuring up to
0.9 cm. There is no axillary lymphadenopathy.

Lungs/Pleura: The trachea and central airways are patent.

The lungs are well inflated. There is no focal consolidation or
pulmonary edema.

There is a 1.4 cm by 1.3 cm subsolid nodular opacity in the left
apex. Ill-defined opacity in the right apex may reflect scarring.
There is no pleural effusion or pneumothorax.

Upper Abdomen: The imaged portion of the upper abdominal viscera are
unremarkable.

Musculoskeletal: There is no acute osseous abnormality.

Review of the MIP images confirms the above findings.
IMPRESSION: 1. No evidence of acute pulmonary embolus.
2. 1.4 cm nodule in the left apex is indeterminate. Prominent
mediastinal and bilateral hilar lymph nodes are also noted,
nonspecific.
3. Consider one of the following in 3 months for both low-risk and
high-risk individuals: (a) repeat chest CT, (b) follow-up PET-CT, or
(c) tissue sampling. This recommendation follows the consensus
statement: Guidelines for Management of Incidental Pulmonary Nodules
Detected on CT Images: From the [HOSPITAL] 9862; Radiology

## 2021-07-30 NOTE — ED Notes (Signed)
Dakota Washington, daughter [phone] give her a call with plan of care

## 2021-07-30 NOTE — ED Notes (Signed)
Pt out of room. Will collect labs once pt back in rm

## 2021-07-30 NOTE — ED Notes (Signed)
Family at bedside. Providing English translation.
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
## 2021-07-30 NOTE — ED Triage Notes (Signed)
Pt reports body aches, chills, fatigue for 9 days. Woke up 2 hrs pta with headache, chest pain and shob. Denies cough.

## 2021-07-30 NOTE — Discharge Instructions (Addendum)
You were seen in the ER today for your body aches.  Your physical exam, vital signs, blood work, and CT scan were very reassuring.  While the exact cause of your symptoms remains unclear, does not appear to be any emergent problem with your heart or your lungs.  Suspect you have a viral infection that is causing your symptoms.  You have been discharged with prescription for an as needed nausea medication.  Please increase your hydration at home and follow-up with the clinic listed below for further management of your diabetes and blood sugars.  Return to ER with any new chest pain, difficulty breathing, nausea or vomiting does not stop, if you pass out, or you develop any other new severe symptoms.  Te vieron en la sala de emergencias hoy por tus dolores en el cuerpo. Su examen fsico, signos vitales, anlisis de [place] y tomografa computarizada fueron muy tranquilizadores. Si bien la causa exacta de sus sntomas sigue sin estar clara, no parece ser ningn problema emergente con su corazn o sus pulmones. Sospecha que tienes una infeccin viral que est causando tus sntomas. Ha sido dado de alta con una receta para un medicamento para las nuseas segn sea necesario. Aumente su hidratacin en casa y haga un seguimiento con la clnica que se indica a continuacin para un mayor control de su diabetes y Dispensing optician.  Regrese a la sala de emergencias con cualquier nuevo dolor en el pecho, dificultad para respirar, nuseas o vmitos que no se detengan, si se desmaya o desarrolla cualquier otro sntoma grave nuevo.
